# Patient Record
Sex: Female | Born: 1960
Health system: Southern US, Community
[De-identification: ages and names within clinical notes are randomized; demographics above are authoritative.]

## PROBLEM LIST (undated history)

## (undated) DIAGNOSIS — E785 Hyperlipidemia, unspecified: Secondary | ICD-10-CM

## (undated) DIAGNOSIS — I10 Essential (primary) hypertension: Secondary | ICD-10-CM

## (undated) HISTORY — DX: Hyperlipidemia, unspecified: E78.5

## (undated) HISTORY — DX: Essential (primary) hypertension: I10

---

## 2015-02-19 ENCOUNTER — Encounter (HOSPITAL_BASED_OUTPATIENT_CLINIC_OR_DEPARTMENT_OTHER): Payer: Self-pay

## 2015-02-19 ENCOUNTER — Emergency Department (HOSPITAL_BASED_OUTPATIENT_CLINIC_OR_DEPARTMENT_OTHER)
Admission: EM | Admit: 2015-02-19 | Discharge: 2015-02-19 | Disposition: A | Discharge status: Home / Self Care | Payer: Self-pay | Attending: Emergency Medicine | Admitting: Emergency Medicine

## 2015-02-19 DIAGNOSIS — Z720 Tobacco use: Secondary | ICD-10-CM | POA: Insufficient documentation

## 2015-02-19 DIAGNOSIS — L259 Unspecified contact dermatitis, unspecified cause: Secondary | ICD-10-CM | POA: Insufficient documentation

## 2015-02-19 MED ORDER — PREDNISONE 20 MG PO TABS: ORAL_TABLET | ORAL | Status: DC

## 2015-02-19 MED ORDER — DEXAMETHASONE SODIUM PHOSPHATE 10 MG/ML IJ SOLN
10.0000 mg | Freq: Once | INTRAMUSCULAR | Status: AC
  Administered 2015-02-19: 10 mg via INTRAMUSCULAR
  Filled 2015-02-19: qty 1

## 2015-02-19 NOTE — ED Provider Notes (Signed)
CSN: [id number]     Arrival date & time 02/19/15  1525 History   First MD Initiated Contact with Patient 02/19/15 1647     Chief Complaint  Patient presents with  . Poison Oak     (Consider location/radiation/quality/duration/timing/severity/associated sxs/prior Treatment) The history is provided by the patient.   54 year old female complaining of a rash 1 week. 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 The rash is very itchy and painful, and is spreading from her hands, to her arms and now on the left side of her face. She went to [facility] 6 days ago and was given a cortisone injection and a prescription for Vistaril, however the rash continues to worsen. States the last time she had this rash, she was on prednisone which made it go away. She is also been applying hydrocortisone cream with mild relief. Denies difficulty breathing or swallowing.  History reviewed. No pertinent past medical history. History reviewed. No pertinent past surgical history. No family history on file. History  Substance Use Topics  . Smoking status: Current Every Day Smoker -- 0.50 packs/day    Types: Cigarettes  . Smokeless tobacco: Not on file  . Alcohol Use: 1.2 oz/week    2 Cans of beer per week   OB History    No data available     Review of Systems  10 Systems reviewed and are negative for acute change except as noted in the HPI.  Allergies  Review of patient's allergies indicates no known allergies.  Home Medications   Prior to Admission medications   Medication Sig Start Date End Date Taking? Authorizing Provider  predniSONE (DELTASONE) 20 MG tablet 2 tabs po daily x 4 days 02/19/15   Nada Boozerobyn M Analleli Gierke, PA-C   BP 156/84 mmHg  Pulse 63  Temp(Src) 97.6 F (36.4 C) (Oral)  Resp 20  Ht 5\' 2"  (1.575 m)  Wt 137 lb (62.143 kg)  BMI 25.05 kg/m2  SpO2 95% Physical Exam  Constitutional: She is oriented to person,  place, and time. She appears well-developed and well-nourished. No distress.  HENT:  Head: Normocephalic and atraumatic.  Mouth/Throat: Oropharynx is clear and moist.  Eyes: Conjunctivae and EOM are normal.  Neck: Normal range of motion. Neck supple.  Cardiovascular: Normal rate, regular rhythm and normal heart sounds.   Pulmonary/Chest: Effort normal and breath sounds normal. No respiratory distress.  Musculoskeletal: Normal range of motion. She exhibits no edema.  Neurological: She is alert and oriented to person, place, and time. No sensory deficit.  Skin: Skin is warm and dry.  Maculopapular, erythematous, excoriated rash to bilateral arms, thighs, lower legs, and left-sided face. No mucosal lesions. No secondary infection.  Psychiatric: She has a normal mood and affect. Her behavior is normal.  Nursing note and vitals reviewed.   ED Course  Procedures (including critical care time) Labs Review Labs Reviewed - No data to display  Imaging Review No results found.   EKG Interpretation None      MDM   Final diagnoses:  Contact dermatitis   No respiratory or airway compromise. NAD. Afebrile. Rashes appearance of contact dermatitis/poison oak. Will give IM Decadron, discharge home with short prednisone burst for 4 days. Advised her to continue taking Vistaril and applying hydrocortisone cream. Stable for discharge. Return precautions given. Patient states understanding of treatment care plan and is agreeable.  Kathrynn SpeedRobyn M Aeden Matranga, PA-C 02/19/15 1706  Arby BarretteMarcy Pfeiffer,  MD 02/19/15 2200

## 2015-02-19 NOTE — ED Notes (Signed)
Patient stated pain was a 6 not an 8 due to rash from poison ivy

## 2015-02-19 NOTE — Discharge Instructions (Signed)
Take prednisone beginning tomorrow as you were given an injection today in the emergency department. Continue using Vistaril at home and applying hydrocortisone cream.  Contact Dermatitis Contact dermatitis is a reaction to certain substances that touch the skin. Contact dermatitis can be either irritant contact dermatitis or allergic contact dermatitis. Irritant contact dermatitis does not require previous exposure to the substance for a reaction to occur.Allergic contact dermatitis only occurs if you have been exposed to the substance before. Upon a repeat exposure, your body reacts to the substance.  CAUSES  Many substances can cause contact dermatitis. Irritant dermatitis is most commonly caused by repeated exposure to mildly irritating substances, such as:  Makeup.  Soaps.  Detergents.  Bleaches.  Acids.  Metal salts, such as nickel. Allergic contact dermatitis is most commonly caused by exposure to:  Poisonous plants.  Chemicals (deodorants, shampoos).  Jewelry.  Latex.  Neomycin in triple antibiotic cream.  Preservatives in products, including clothing. SYMPTOMS  The area of skin that is exposed may develop:  Dryness or flaking.  Redness.  Cracks.  Itching.  Pain or a burning sensation.  Blisters. With allergic contact dermatitis, there may also be swelling in areas such as the eyelids, mouth, or genitals.  DIAGNOSIS  Your caregiver can usually tell what the problem is by doing a physical exam. In cases where the cause is uncertain and an allergic contact dermatitis is suspected, a patch skin test may be performed to help determine the cause of your dermatitis. TREATMENT Treatment includes protecting the skin from further contact with the irritating substance by avoiding that substance if possible. Barrier creams, powders, and gloves may be helpful. Your caregiver may also recommend:  Steroid creams or ointments applied 2 times daily. For best results, soak  the rash area in cool water for 20 minutes. Then apply the medicine. Cover the area with a plastic wrap. You can store the steroid cream in the refrigerator for a "chilly" effect on your rash. That may decrease itching. Oral steroid medicines may be needed in more severe cases.  Antibiotics or antibacterial ointments if a skin infection is present.  Antihistamine lotion or an antihistamine taken by mouth to ease itching.  Lubricants to keep moisture in your skin.  Burow's solution to reduce redness and soreness or to dry a weeping rash. Mix one packet or tablet of solution in 2 cups cool water. Dip a clean washcloth in the mixture, wring it out a bit, and put it on the affected area. Leave the cloth in place for 30 minutes. Do this as often as possible throughout the day.  Taking several cornstarch or baking soda baths daily if the area is too large to cover with a washcloth. Harsh chemicals, such as alkalis or acids, can cause skin damage that is like a burn. You should flush your skin for 15 to 20 minutes with cold water after such an exposure. You should also seek immediate medical care after exposure. Bandages (dressings), antibiotics, and pain medicine may be needed for severely irritated skin.  HOME CARE INSTRUCTIONS  Avoid the substance that caused your reaction.  Keep the area of skin that is affected away from hot water, soap, sunlight, chemicals, acidic substances, or anything else that would irritate your skin.  Do not scratch the rash. Scratching may cause the rash to become infected.  You may take cool baths to help stop the itching.  Only take over-the-counter or prescription medicines as directed by your caregiver.  See your caregiver for  follow-up care as directed to make sure your skin is healing properly. SEEK MEDICAL CARE IF:   Your condition is not better after 3 days of treatment.  You seem to be getting worse.  You see signs of infection such as swelling,  tenderness, redness, soreness, or warmth in the affected area.  You have any problems related to your medicines. Document Released: 10/26/2000 Document Revised: 01/21/2012 Document Reviewed: 04/03/2011 [facility] Patient Information 2015 [place], [place]. This information is not intended to replace advice given to you by your health care provider. Make sure you discuss any questions you have with your health care provider.

## 2015-02-19 NOTE — ED Notes (Signed)
Pt reports poison oak diffusely on body, pruritic and painful.  Went to [facility], given hydrocortisone but rash still spreading.

## 2016-01-24 ENCOUNTER — Emergency Department (HOSPITAL_BASED_OUTPATIENT_CLINIC_OR_DEPARTMENT_OTHER)
Admission: EM | Admit: 2016-01-24 | Discharge: 2016-01-24 | Disposition: A | Discharge status: Home / Self Care | Payer: Self-pay | Attending: Emergency Medicine | Admitting: Emergency Medicine

## 2016-01-24 ENCOUNTER — Encounter (HOSPITAL_BASED_OUTPATIENT_CLINIC_OR_DEPARTMENT_OTHER): Payer: Self-pay | Admitting: *Deleted

## 2016-01-24 DIAGNOSIS — F17210 Nicotine dependence, cigarettes, uncomplicated: Secondary | ICD-10-CM | POA: Insufficient documentation

## 2016-01-24 DIAGNOSIS — J209 Acute bronchitis, unspecified: Secondary | ICD-10-CM | POA: Insufficient documentation

## 2016-01-24 MED ORDER — AZITHROMYCIN 250 MG PO TABS: ORAL_TABLET | ORAL | Status: DC

## 2016-01-24 MED ORDER — PREDNISONE 10 MG PO TABS: 20.0000 mg | ORAL_TABLET | Freq: Two times a day (BID) | ORAL | Status: DC

## 2016-01-24 MED ORDER — GUAIFENESIN-CODEINE 100-10 MG/5ML PO SOLN: 10.0000 mL | Freq: Four times a day (QID) | ORAL | Status: DC | PRN

## 2016-01-24 MED FILL — AZITHROMYCIN 250 MG TABLET: 250 | 5 days supply | Qty: 6 | Fill #0

## 2016-01-24 MED FILL — predniSONE 10 MG TABS: 10 | 5 days supply | Qty: 20 | Fill #0

## 2016-01-24 MED FILL — CHERATUSSIN AC SYRUP: 100-10 | 3 days supply | Qty: 120 | Fill #0

## 2016-01-24 NOTE — Discharge Instructions (Signed)
Zithromax as prescribed.  Prednisone as prescribed.  Robitussin with codeine as prescribed as needed for cough.  Return to the emergency department if symptoms significantly worsen or change.   Acute Bronchitis Bronchitis is inflammation of the airways that extend from the windpipe into the lungs (bronchi). The inflammation often causes mucus to develop. This leads to a cough, which is the most common symptom of bronchitis.  In acute bronchitis, the condition usually develops suddenly and goes away over time, usually in a couple weeks. Smoking, allergies, and asthma can make bronchitis worse. Repeated episodes of bronchitis may cause further lung problems.  CAUSES Acute bronchitis is most often caused by the same virus that causes a cold. The virus can spread from person to person (contagious) through coughing, sneezing, and touching contaminated objects. SIGNS AND SYMPTOMS   Cough.   Fever.   Coughing up mucus.   Body aches.   Chest congestion.   Chills.   Shortness of breath.   Sore throat.  DIAGNOSIS  Acute bronchitis is usually diagnosed through a physical exam. Your health care provider will also ask you questions about your medical history. Tests, such as chest X-rays, are sometimes done to rule out other conditions.  TREATMENT  Acute bronchitis usually goes away in a couple weeks. Oftentimes, no medical treatment is necessary. Medicines are sometimes given for relief of fever or cough. Antibiotic medicines are usually not needed but may be prescribed in certain situations. In some cases, an inhaler may be recommended to help reduce shortness of breath and control the cough. A cool mist vaporizer may also be used to help thin bronchial secretions and make it easier to clear the chest.  HOME CARE INSTRUCTIONS  Get plenty of rest.   Drink enough fluids to keep your urine clear or pale yellow (unless you have a medical condition that requires fluid restriction).  Increasing fluids may help thin your respiratory secretions (sputum) and reduce chest congestion, and it will prevent dehydration.   Take medicines only as directed by your health care provider.  If you were prescribed an antibiotic medicine, finish it all even if you start to feel better.  Avoid smoking and secondhand smoke. Exposure to cigarette smoke or irritating chemicals will make bronchitis worse. If you are a smoker, consider using nicotine gum or skin patches to help control withdrawal symptoms. Quitting smoking will help your lungs heal faster.   Reduce the chances of another bout of acute bronchitis by washing your hands frequently, avoiding people with cold symptoms, and trying not to touch your hands to your mouth, nose, or eyes.   Keep all follow-up visits as directed by your health care provider.  SEEK MEDICAL CARE IF: Your symptoms do not improve after 1 week of treatment.  SEEK IMMEDIATE MEDICAL CARE IF:  You develop an increased fever or chills.   You have chest pain.   You have severe shortness of breath.  You have bloody sputum.   You develop dehydration.  You faint or repeatedly feel like you are going to pass out.  You develop repeated vomiting.  You develop a severe headache. MAKE SURE YOU:   Understand these instructions.  Will watch your condition.  Will get help right away if you are not doing well or get worse.   This information is not intended to replace advice given to you by your health care provider. Make sure you discuss any questions you have with your health care provider.   Document Released: 12/06/2004 Document  Revised: 11/19/2014 Document Reviewed: 04/21/2013 Elsevier Interactive Patient Education [employer].

## 2016-01-24 NOTE — ED Notes (Signed)
Cough, fever, headache and sore throat x 2 weeks. Aching all over.

## 2016-01-26 NOTE — ED Provider Notes (Signed)
CSN: [id number]     Arrival date & time 01/24/16  1243 History   First MD Initiated Contact with Patient 01/24/16 1308     Chief Complaint  Patient presents with  . Cough     (Consider location/radiation/quality/duration/timing/severity/associated sxs/prior Treatment) HPI Comments: Patient is a 55 year old female who presents with a two-week history of fever, cough, congestion. She has tried over-the-counter medications with no relief. He denies any chest pain or shortness of breath.  Patient is a 55 y.o. female presenting with cough. The history is provided by the patient.  Cough Cough characteristics:  Non-productive and productive Severity:  Moderate Onset quality:  Gradual Duration:  2 weeks Timing:  Constant Progression:  Worsening Chronicity:  New Relieved by:  Nothing Worsened by:  Nothing tried Ineffective treatments:  Cough suppressants Associated symptoms: myalgias   Associated symptoms: no chills and no fever     History reviewed. No pertinent past medical history. History reviewed. No pertinent past surgical history. No family history on file. Social History  Substance Use Topics  . Smoking status: Current Every Day Smoker -- 0.50 packs/day    Types: Cigarettes  . Smokeless tobacco: None  . Alcohol Use: 1.2 oz/week    2 Cans of beer per week   OB History    No data available     Review of Systems  Constitutional: Negative for fever and chills.  Respiratory: Positive for cough.   Musculoskeletal: Positive for myalgias.  All other systems reviewed and are negative.     Allergies  Review of patient's allergies indicates no known allergies.  Home Medications   Prior to Admission medications   Medication Sig Start Date End Date Taking? Authorizing Provider  azithromycin (ZITHROMAX Z-PAK) 250 MG tablet 2 po day one, then 1 daily x 4 days 01/24/16   Geoffery Lyons, MD  guaiFENesin-codeine 100-10 MG/5ML syrup Take 10 mLs by mouth every 6 (six) hours as  needed for cough. 01/24/16   Geoffery Lyons, MD  predniSONE (DELTASONE) 10 MG tablet Take 2 tablets (20 mg total) by mouth 2 (two) times daily. 01/24/16   Geoffery Lyons, MD   BP 160/95 mmHg  Pulse 80  Temp(Src) 98.3 F (36.8 C) (Oral)  Resp 20  Ht  (1.575 m)  Wt 137 lb (62.143 kg)  BMI 25.05 kg/m2  SpO2 100% Physical Exam  Constitutional: She is oriented to person, place, and time. She appears well-developed and well-nourished. No distress.  HENT:  Head: Normocephalic and atraumatic.  Neck: Normal range of motion. Neck supple.  Cardiovascular: Normal rate and regular rhythm.  Exam reveals no gallop and no friction rub.   No murmur heard. Pulmonary/Chest: Effort normal and breath sounds normal. No respiratory distress. She has no wheezes.  Abdominal: Soft. Bowel sounds are normal. She exhibits no distension. There is no tenderness.  Musculoskeletal: Normal range of motion.  Neurological: She is alert and oriented to person, place, and time.  Skin: Skin is warm and dry. She is not diaphoretic.  Nursing note and vitals reviewed.   ED Course  Procedures (including critical care time) Labs Review Labs Reviewed - No data to display  Imaging Review No results found. I have personally reviewed and evaluated these images and lab results as part of my medical decision-making.   EKG Interpretation None      MDM   Final diagnoses:  Acute bronchitis, unspecified organism    Due to the persistent nature of this illness, she will be prescribed antibiotics and when  necessary return.    Geoffery Lyonsouglas Teriana Danker, MD 01/26/16 [phone]

## 2017-03-02 ENCOUNTER — Encounter (HOSPITAL_BASED_OUTPATIENT_CLINIC_OR_DEPARTMENT_OTHER): Payer: Self-pay | Admitting: Emergency Medicine

## 2017-03-02 ENCOUNTER — Emergency Department (HOSPITAL_BASED_OUTPATIENT_CLINIC_OR_DEPARTMENT_OTHER)
Admission: EM | Admit: 2017-03-02 | Discharge: 2017-03-02 | Disposition: A | Discharge status: Home / Self Care | Payer: Self-pay | Attending: Emergency Medicine | Admitting: Emergency Medicine

## 2017-03-02 DIAGNOSIS — L255 Unspecified contact dermatitis due to plants, except food: Secondary | ICD-10-CM | POA: Insufficient documentation

## 2017-03-02 DIAGNOSIS — L237 Allergic contact dermatitis due to plants, except food: Secondary | ICD-10-CM

## 2017-03-02 DIAGNOSIS — F17210 Nicotine dependence, cigarettes, uncomplicated: Secondary | ICD-10-CM | POA: Insufficient documentation

## 2017-03-02 MED ORDER — PREDNISONE 20 MG PO TABS: ORAL_TABLET | ORAL | 0 refills | Status: DC

## 2017-03-02 MED ORDER — DIPHENHYDRAMINE HCL 25 MG PO TABS: 25.0000 mg | ORAL_TABLET | Freq: Four times a day (QID) | ORAL | 0 refills | Status: DC | PRN

## 2017-03-02 NOTE — ED Provider Notes (Signed)
[facility] Provider Note   CSN: [id number] Arrival date & time: 03/02/17  1634  By signing my name below, I, Doreatha Martin and Deland Pretty, attest that this documentation has been prepared under the direction and in the presence of [name], PA-C. Electronically Signed: Doreatha Martin and Deland Pretty, ED Scribe. 03/02/17. 5:14 PM.    History   Chief Complaint Chief Complaint  Patient presents with  . Rash    HPI Sharon Christensen is a 56 y.o. who presents to the Emergency Department complaining of a gradually spreading pruritic rash to the left face and bilateral forearms that began this morning. Pt was chasing a goat last night in the woods prior to the onset of her symptoms. She states she tried applying calamine lotion PTA with no relief. 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 No new soaps, lotions, detergents, pets or medications. No known sick contacts with similar symptoms. She denies trouble breathing, fever, tongue or lip swelling, CP, abdominal pain. No history of diabetes. Has hx of allergy to poison oak/ivy in the past.  The history is provided by the patient. No language interpreter was used.    History reviewed. No pertinent past medical history.  There are no active problems to display for this patient.   History reviewed. No pertinent surgical history.  OB History    No data available       Home Medications    Prior to Admission medications   Not on File    Family History No family history on file.  Social History Social History  Substance Use Topics  . Smoking status: Current Every Day Smoker    Packs/day: 0.50    Types: Cigarettes  . Smokeless tobacco: Not on file  . Alcohol use 1.2 oz/week    2 Cans of beer per week     Allergies   Patient has no known allergies.   Review of Systems Review of Systems  Constitutional: Negative for fever.  HENT: Negative for facial  swelling and trouble swallowing.   Respiratory: Negative for shortness of breath.   Cardiovascular: Negative for chest pain.  Gastrointestinal: Negative for abdominal pain.  Skin: Positive for rash.     Physical Exam Updated Vital Signs BP (!) 181/107 (BP Location: Left Arm)   Pulse 90   Temp 98.4 F (36.9 C) (Oral)   Resp 19   SpO2 98%   Physical Exam  Constitutional: She appears well-developed and well-nourished.  HENT:  Head: Normocephalic.  Normal oral mucosa.  Eyes: Conjunctivae are normal.  Cardiovascular: Normal rate, regular rhythm and normal heart sounds.   Pulmonary/Chest: Effort normal and breath sounds normal. No respiratory distress. She has no wheezes.  Abdominal: She exhibits no distension.  Musculoskeletal: Normal range of motion.  Neurological: She is alert.  Skin: Skin is warm and dry. Rash noted.  Pruritic hives noted to the left side of face and bilateral forearms. No cellulitis or abscess.   Psychiatric: She has a normal mood and affect. Her behavior is normal.  Nursing note and vitals reviewed.    ED Treatments / Results   DIAGNOSTIC STUDIES: Oxygen Saturation is 98% on RA, normal by my interpretation.    COORDINATION OF CARE: 5:10 PM Discussed treatment plan with pt at bedside which includes steroids and antihistamine and pt agreed to plan.   Procedures Procedures (including critical care time)  Medications Ordered in ED Medications - No data to display  Initial Impression / Assessment and Plan / ED Course  I have reviewed the triage vital signs and the nursing notes.     BP (!) 181/107 (BP Location: Left Arm)   Pulse 90   Temp 98.4 F (36.9 C) (Oral)   Resp 19   SpO2 98%  The patient was noted to be hypertensive today in the emergency department. I have spoken with the patient regarding hypertension and the need for improved management. I instructed the patient to followup with the Primary care doctor within 4 days to improve the  management of the patient's hypertension. I also counseled the patient regarding the signs and symptoms which would require an emergent visit to an emergency department for hypertensive urgency and/or hypertensive emergency. The patient understood the need for improved hypertensive management.   [name] presents to the ED for evaluation of possible poison ivy/oak exposure. Patient will be sent home with steroid course. Conservative therapies discussed and recommended, including Benadryl, oatmeal baths and calamine lotions. Patient advised to follow up with PCP as needed, or with worsening symptoms. Patient appears stable for discharge at this time. Return precautions discussed and outlined in discharge paperwork. Patient is agreeable to plan.     Final Clinical Impressions(s) / ED Diagnoses   Final diagnoses:  Contact dermatitis due to poison ivy    New Prescriptions New Prescriptions   DIPHENHYDRAMINE (BENADRYL) 25 MG TABLET    Take 1 tablet (25 mg total) by mouth every 6 (six) hours as needed for itching or allergies.   PREDNISONE (DELTASONE) 20 MG TABLET    3 tabs po daily x 3 days, then 2 tabs x 3 days, then 1.5 tabs x 3 days, then 1 tab x 3 days, then 0.5 tabs x 3 days    I personally performed the services described in this documentation, which was scribed in my presence. The recorded information has been reviewed and is accurate.       [name], PA-C 03/02/17 1722    Laurence Spates, MD 03/05/17 [phone]

## 2017-03-19 ENCOUNTER — Emergency Department (HOSPITAL_BASED_OUTPATIENT_CLINIC_OR_DEPARTMENT_OTHER): Payer: Self-pay

## 2017-03-19 ENCOUNTER — Encounter (HOSPITAL_BASED_OUTPATIENT_CLINIC_OR_DEPARTMENT_OTHER): Payer: Self-pay | Admitting: Emergency Medicine

## 2017-03-19 ENCOUNTER — Emergency Department (HOSPITAL_BASED_OUTPATIENT_CLINIC_OR_DEPARTMENT_OTHER)
Admission: EM | Admit: 2017-03-19 | Discharge: 2017-03-19 | Disposition: A | Discharge status: Home / Self Care | Payer: Self-pay | Attending: Emergency Medicine | Admitting: Emergency Medicine

## 2017-03-19 DIAGNOSIS — W19XXXA Unspecified fall, initial encounter: Secondary | ICD-10-CM

## 2017-03-19 DIAGNOSIS — S93402A Sprain of unspecified ligament of left ankle, initial encounter: Secondary | ICD-10-CM | POA: Insufficient documentation

## 2017-03-19 DIAGNOSIS — Y999 Unspecified external cause status: Secondary | ICD-10-CM | POA: Insufficient documentation

## 2017-03-19 DIAGNOSIS — W108XXA Fall (on) (from) other stairs and steps, initial encounter: Secondary | ICD-10-CM | POA: Insufficient documentation

## 2017-03-19 DIAGNOSIS — Y9301 Activity, walking, marching and hiking: Secondary | ICD-10-CM | POA: Insufficient documentation

## 2017-03-19 DIAGNOSIS — Z79899 Other long term (current) drug therapy: Secondary | ICD-10-CM | POA: Insufficient documentation

## 2017-03-19 DIAGNOSIS — F17210 Nicotine dependence, cigarettes, uncomplicated: Secondary | ICD-10-CM | POA: Insufficient documentation

## 2017-03-19 DIAGNOSIS — Y929 Unspecified place or not applicable: Secondary | ICD-10-CM | POA: Insufficient documentation

## 2017-03-19 NOTE — Discharge Instructions (Signed)
Please call [facility] to follow up with your blood pressure. You can also mention the fatigue. Please keep your ankle elevated above the level of your heart when sitting, apply ice for 15-20 minutes 3-4 times per day, and rest your foot. You can also take 800 mg of ibuprofen/motrin/Advil every 8 hours. Try to slowly start moving the foot to keep it from getting overly stiff. It is normal for the bruising to change colors and may look worse before it looks better. If you develop a fever, chills, the foot becomes icy cold, numbness or tingling, or worsening pain, please return to the Emergency Department for re-evaluation.

## 2017-03-19 NOTE — ED Triage Notes (Signed)
Pt fell on porch last night.  Pt landed on left foot.  Noted swelling and bruising to left ankle.  Pt limping.  Good cms.

## 2017-03-22 NOTE — ED Provider Notes (Signed)
[facility] Provider Note   CSN: [id number] Arrival date & time: 03/19/17  0957     History   Chief Complaint Chief Complaint  Patient presents with  . Ankle Pain    HPI Sharon Christensen is a 56 y.o. female who presents to the Emergency Department with acute left ankle pain. She report that she was walking down stairs last night, when she fell forward and landed on her left ankle. She reports she did not hit her head, lose conscious, N/V/D. She has been able to ambulate wimp. Tetanus is UTD.   The history is provided by the patient. No language interpreter was used.    No past medical history on file.  There are no active problems to display for this patient.   No past surgical history on file.  OB History    No data available       Home Medications    Prior to Admission medications   Medication Sig Start Date End Date Taking? Authorizing Provider  pseudoephedrine (SUDAFED) 30 MG tablet Take 30 mg by mouth every 4 (four) hours as needed for congestion.   Yes [provider]  diphenhydrAMINE (BENADRYL) 25 MG tablet Take 1 tablet (25 mg total) by mouth every 6 (six) hours as needed for itching or allergies. 03/02/17   [name], PA-C    Family History No family history on file.  Social History Social History  Substance Use Topics  . Smoking status: Current Every Day Smoker    Packs/day: 0.50    Types: Cigarettes  . Smokeless tobacco: Never Used  . Alcohol use 1.2 oz/week    2 Cans of beer per week     Allergies   Patient has no known allergies.   Review of Systems Review of Systems  Constitutional: Negative for activity change, chills and fever.  HENT: Negative for congestion.   Eyes: Negative for visual disturbance.  Respiratory: Negative for shortness of breath.   Cardiovascular: Negative for chest pain.  Gastrointestinal: Negative for abdominal pain, diarrhea, nausea and vomiting.  Genitourinary: Negative for flank pain.    Musculoskeletal: Positive for arthralgias, gait problem, joint swelling and myalgias. Negative for back pain.  Skin: Positive for wound. Negative for rash.  Allergic/Immunologic: Negative for immunocompromised state.  Neurological: Negative for dizziness, light-headedness and headaches.  Psychiatric/Behavioral: Negative for confusion.   Physical Exam Updated Vital Signs BP (!) 171/109   Pulse 84   Temp 97.7 F (36.5 C) (Oral)   Resp 16   Ht 5\' 2"  (1.575 m)   Wt 65.8 kg   SpO2 98%   BMI 26.52 kg/m   Physical Exam  Constitutional: She appears well-developed and well-nourished. No distress.  HENT:  Head: Normocephalic and atraumatic.  Eyes: Conjunctivae are normal.  Neck: Neck supple.  Cardiovascular: Normal rate and regular rhythm.  Exam reveals no gallop and no friction rub.   No murmur heard. Pulmonary/Chest: Effort normal and breath sounds normal. No respiratory distress. She has no wheezes. She has no rales.  Abdominal: Soft. She exhibits no distension. There is no tenderness. There is no guarding.  Musculoskeletal: Normal range of motion. She exhibits edema and tenderness.  Full ROM of the left ankle. TTP over the anterolateral aspect with extensive swelling and induration. NVI.   Neurological: She is alert.  Skin: Skin is warm and dry. No rash noted. She is not diaphoretic.  Psychiatric: Her behavior is normal.  Nursing note and vitals reviewed.    ED Treatments /  Results  Labs (all labs ordered are listed, but only abnormal results are displayed) Labs Reviewed - No data to display  EKG  EKG Interpretation None       Radiology No results found.  Procedures Procedures (including critical care time)  Medications Ordered in ED Medications - No data to display   Initial Impression / Assessment and Plan / ED Course  I have reviewed the triage vital signs and the nursing notes.  Pertinent labs & imaging results that were available during my care of the  patient were reviewed by me and considered in my medical decision making (see chart for details).     Patient X-Ray negative for obvious fracture or dislocation. Pt advised to follow up with orthopedics if symptoms persist for possibility of missed fracture diagnosis. Patient given brace and crutches while in ED, conservative therapy recommended and discussed. BP initially 181/107 to 171/109. No end organ damage noted in history or physical [facility] with her blood pressure.. Patient will be dc home & is agreeable with above plan.  Final Clinical Impressions(s) / ED Diagnoses   Final diagnoses:  Sprain of left ankle, initial encounter    New Prescriptions Discharge Medication List as of 03/19/2017 10:56 AM       Barkley BoardsMcDonald, Khandi Kernes A, PA-C 03/22/17 0116    Charlynne PanderYao, David Hsienta, MD 03/23/17 [phone]

## 2017-06-28 ENCOUNTER — Ambulatory Visit (INDEPENDENT_AMBULATORY_CARE_PROVIDER_SITE_OTHER): Payer: Self-pay | Admitting: Family Medicine

## 2017-06-28 ENCOUNTER — Encounter: Payer: Self-pay | Admitting: Family Medicine

## 2017-06-28 VITALS — BP 140/77 | HR 77 | Temp 97.7°F | Resp 14 | Ht 62.0 in | Wt 144.0 lb

## 2017-06-28 DIAGNOSIS — Z1239 Encounter for other screening for malignant neoplasm of breast: Secondary | ICD-10-CM

## 2017-06-28 DIAGNOSIS — R5383 Other fatigue: Secondary | ICD-10-CM

## 2017-06-28 DIAGNOSIS — Z1159 Encounter for screening for other viral diseases: Secondary | ICD-10-CM

## 2017-06-28 DIAGNOSIS — Z1231 Encounter for screening mammogram for malignant neoplasm of breast: Secondary | ICD-10-CM

## 2017-06-28 DIAGNOSIS — I10 Essential (primary) hypertension: Secondary | ICD-10-CM

## 2017-06-28 LAB — POCT URINALYSIS DIP (DEVICE)
Bilirubin Urine: NEGATIVE
Glucose, UA: NEGATIVE mg/dL
HGB URINE DIPSTICK: NEGATIVE
Ketones, ur: NEGATIVE mg/dL
Leukocytes, UA: NEGATIVE
NITRITE: NEGATIVE
PH: 5 (ref 5.0–8.0)
PROTEIN: NEGATIVE mg/dL
Specific Gravity, Urine: 1.025 (ref 1.005–1.030)
UROBILINOGEN UA: 0.2 mg/dL (ref 0.0–1.0)

## 2017-06-28 LAB — CBC WITH DIFFERENTIAL/PLATELET
BASOS ABS: 55 {cells}/uL (ref 0–200)
BASOS PCT: 1 %
EOS ABS: 55 {cells}/uL (ref 15–500)
Eosinophils Relative: 1 %
HEMATOCRIT: 41 % (ref 35.0–45.0)
HEMOGLOBIN: 13.7 g/dL (ref 11.7–15.5)
Lymphocytes Relative: 32 %
Lymphs Abs: 1760 cells/uL (ref 850–3900)
MCH: 32.2 pg (ref 27.0–33.0)
MCHC: 33.4 g/dL (ref 32.0–36.0)
MCV: 96.2 fL (ref 80.0–100.0)
MONOS PCT: 9 %
MPV: 9.1 fL (ref 7.5–12.5)
Monocytes Absolute: 495 cells/uL (ref 200–950)
Neutro Abs: 3135 cells/uL (ref 1500–7800)
Neutrophils Relative %: 57 %
Platelets: 352 10*3/uL (ref 140–400)
RBC: 4.26 MIL/uL (ref 3.80–5.10)
RDW: 13 % (ref 11.0–15.0)
WBC: 5.5 10*3/uL (ref 3.8–10.8)

## 2017-06-28 LAB — POCT GLYCOSYLATED HEMOGLOBIN (HGB A1C): HEMOGLOBIN A1C: 5.5

## 2017-06-28 MED ORDER — LISINOPRIL 20 MG PO TABS: 20.0000 mg | ORAL_TABLET | Freq: Every day | ORAL | 3 refills | Status: DC

## 2017-06-28 MED FILL — LISINOPRIL 20 MG TAB: 20 | 30 days supply | Qty: 30 | Fill #0

## 2017-06-28 NOTE — Patient Instructions (Addendum)
Start daily aspirin 81 mg for optimal cardiovascular health. I am starting you on lisinopril 20 mg once daily for hypertension management. For nasal congestion, I recommend starting a daily antihistamine regiment with either Cetirizine or  Fexofenadine.  To schedule your breast exam, please call [facility] at   Phone: [phone]   Dizziness Dizziness is a common problem. It makes you feel unsteady or light-headed. You may feel like you are about to pass out (faint). Dizziness can lead to getting hurt if you stumble or fall. Dizziness can be caused by many things, including:  Medicines.  Not having enough water in your body (dehydration).  Illness.  Follow these instructions at home: Eating and drinking  Drink enough fluid to keep your pee (urine) clear or pale yellow. This helps to keep you from getting dehydrated. Try to drink more clear fluids, such as water.  Do not drink alcohol.  Limit how much caffeine you drink or eat, if your doctor tells you to do that.  Limit how much salt (sodium) you drink or eat, if your doctor tells you to do that. Activity  Avoid making quick movements. ? When you stand up from sitting in a chair, steady yourself until you feel okay. ? In the morning, first sit up on the side of the bed. When you feel okay, stand slowly while you hold onto something. Do this until you know that your balance is fine.  If you need to stand in one place for a long time, move your legs often. Tighten and relax the muscles in your legs while you are standing.  Do not drive or use heavy machinery if you feel dizzy.  Avoid bending down if you feel dizzy. Place items in your home so you can reach them easily without leaning over. Lifestyle  Do not use any products that contain nicotine or tobacco, such as cigarettes and e-cigarettes. If you need help quitting, ask your doctor.  Try to lower your stress level. You can do this by using methods such as  yoga or meditation. Talk with your doctor if you need help. General instructions  Watch your dizziness for any changes.  Take over-the-counter and prescription medicines only as told by your doctor. Talk with your doctor if you think that you are dizzy because of a medicine that you are taking.  Tell a friend or a family member that you are feeling dizzy. If he or she notices any changes in your behavior, have this person call your doctor.  Keep all follow-up visits as told by your doctor. This is important. Contact a doctor if:  Your dizziness does not go away.  Your dizziness or light-headedness gets worse.  You feel sick to your stomach (nauseous).  You have trouble hearing.  You have new symptoms.  You are unsteady on your feet.  You feel like the room is spinning. Get help right away if:  You throw up (vomit) or have watery poop (diarrhea), and you cannot eat or drink anything.  You have trouble: ? Talking. ? Walking. ? Swallowing. ? Using your arms, hands, or legs.  You feel generally weak.  You are not thinking clearly, or you have trouble forming sentences. A friend or family member may notice this.  You have: ? Chest pain. ? Pain in your belly (abdomen). ? Shortness of breath. ? Sweating.  Your vision changes.  You are bleeding.  You have a very bad headache.  You have neck pain  or a stiff neck.  You have a fever. These symptoms may be an emergency. Do not wait to see if the symptoms will go away. Get medical help right away. Call your local emergency services (911 in the [place] Do not drive yourself to the hospital. Summary  Dizziness makes you feel unsteady or light-headed. You may feel like you are about to pass out (faint).  Drink enough fluid to keep your pee (urine) clear or pale yellow. Do not drink alcohol.  Avoid making quick movements if you feel dizzy.  Watch your dizziness for any changes. This information is not intended to replace  advice given to you by your health care provider. Make sure you discuss any questions you have with your health care provider. Document Released: 10/18/2011 Document Revised: 11/15/2016 Document Reviewed: 11/15/2016 Elsevier Interactive Patient Education  2017 [employer]  Fatigue Fatigue is feeling tired all of the time, a lack of energy, or a lack of motivation. Occasional or mild fatigue is often a normal response to activity or life in general. However, long-lasting (chronic) or extreme fatigue may indicate an underlying medical condition. Follow these instructions at home: Watch your fatigue for any changes. The following actions may help to lessen any discomfort you are feeling:  Talk to your health care provider about how much sleep you need each night. Try to get the required amount every night.  Take medicines only as directed by your health care provider.  Eat a healthy and nutritious diet. Ask your health care provider if you need help changing your diet.  Drink enough fluid to keep your urine clear or pale yellow.  Practice ways of relaxing, such as yoga, meditation, massage therapy, or acupuncture.  Exercise regularly.  Change situations that cause you stress. Try to keep your work and personal routine reasonable.  Do not abuse illegal drugs.  Limit alcohol intake to no more than 1 drink per day for nonpregnant women and 2 drinks per day for men. One drink equals 12 ounces of beer, 5 ounces of wine, or 1 ounces of hard liquor.  Take a multivitamin, if directed by your health care provider.  Contact a health care provider if:  Your fatigue does not get better.  You have a fever.  You have unintentional weight loss or gain.  You have headaches.  You have difficulty: ? Falling asleep. ? Sleeping throughout the night.  You feel angry, guilty, anxious, or sad.  You are unable to have a bowel movement (constipation).  You skin is dry.  Your legs or another  part of your body is swollen. Get help right away if:  You feel confused.  Your vision is blurry.  You feel faint or pass out.  You have a severe headache.  You have severe abdominal, pelvic, or back pain.  You have chest pain, shortness of breath, or an irregular or fast heartbeat.  You are unable to urinate or you urinate less than normal.  You develop abnormal bleeding, such as bleeding from the rectum, vagina, nose, lungs, or nipples.  You vomit blood.  You have thoughts about harming yourself or committing suicide.  You are worried that you might harm someone else. This information is not intended to replace advice given to you by your health care provider. Make sure you discuss any questions you have with your health care provider. Document Released: 08/26/2007 Document Revised: 04/05/2016 Document Reviewed: 03/02/2014 Elsevier Interactive Patient Education  2018 [employer] Inc[employer]  DASH Eating Plan DASH  stands for "Dietary Approaches to Stop Hypertension." The DASH eating plan is a healthy eating plan that has been shown to reduce high blood pressure (hypertension). It may also reduce your risk for type 2 diabetes, heart disease, and stroke. The DASH eating plan may also help with weight loss. What are tips for following this plan? General guidelines  Avoid eating more than 2,300 mg (milligrams) of salt (sodium) a day. If you have hypertension, you may need to reduce your sodium intake to 1,500 mg a day.  Limit alcohol intake to no more than 1 drink a day for nonpregnant women and 2 drinks a day for men. One drink equals 12 oz of beer, 5 oz of wine, or 1 oz of hard liquor.  Work with your health care provider to maintain a healthy body weight or to lose weight. Ask what an ideal weight is for you.  Get at least 30 minutes of exercise that causes your heart to beat faster (aerobic exercise) most days of the week. Activities may include walking, swimming, or biking.  Work  with your health care provider or diet and nutrition specialist (dietitian) to adjust your eating plan to your individual calorie needs. Reading food labels  Check food labels for the amount of sodium per serving. Choose foods with less than 5 percent of the Daily Value of sodium. Generally, foods with less than 300 mg of sodium per serving fit into this eating plan.  To find whole grains, look for the word "whole" as the first word in the ingredient list. Shopping  Buy products labeled as "low-sodium" or "no salt added."  Buy fresh foods. Avoid canned foods and premade or frozen meals. Cooking  Avoid adding salt when cooking. Use salt-free seasonings or herbs instead of table salt or sea salt. Check with your health care provider or pharmacist before using salt substitutes.  Do not fry foods. Cook foods using healthy methods such as baking, boiling, grilling, and broiling instead.  Cook with heart-healthy oils, such as olive, canola, soybean, or sunflower oil. Meal planning   Eat a balanced diet that includes: ? 5 or more servings of fruits and vegetables each day. At each meal, try to fill half of your plate with fruits and vegetables. ? Up to 6-8 servings of whole grains each day. ? Less than 6 oz of lean meat, poultry, or fish each day. A 3-oz serving of meat is about the same size as a deck of cards. One egg equals 1 oz. ? 2 servings of low-fat dairy each day. ? A serving of nuts, seeds, or beans 5 times each week. ? Heart-healthy fats. Healthy fats called Omega-3 fatty acids are found in foods such as flaxseeds and coldwater fish, like sardines, salmon, and mackerel.  Limit how much you eat of the following: ? Canned or prepackaged foods. ? Food that is high in trans fat, such as fried foods. ? Food that is high in saturated fat, such as fatty meat. ? Sweets, desserts, sugary drinks, and other foods with added sugar. ? Full-fat dairy products.  Do not salt foods before  eating.  Try to eat at least 2 vegetarian meals each week.  Eat more home-cooked food and less restaurant, buffet, and fast food.  When eating at a restaurant, ask that your food be prepared with less salt or no salt, if possible. What foods are recommended? The items listed may not be a complete list. Talk with your dietitian about what dietary choices are  best for you. Grains Whole-grain or whole-wheat bread. Whole-grain or whole-wheat pasta. Brown rice. Orpah Cobb. Bulgur. Whole-grain and low-sodium cereals. Pita bread. Low-fat, low-sodium crackers. Whole-wheat flour tortillas. Vegetables Fresh or frozen vegetables (raw, steamed, roasted, or grilled). Low-sodium or reduced-sodium tomato and vegetable juice. Low-sodium or reduced-sodium tomato sauce and tomato paste. Low-sodium or reduced-sodium canned vegetables. Fruits All fresh, dried, or frozen fruit. Canned fruit in natural juice (without added sugar). Meat and other protein foods Skinless chicken or [place]. Ground chicken or [place]. Pork with fat trimmed off. Fish and seafood. Egg whites. Dried beans, peas, or lentils. Unsalted nuts, nut butters, and seeds. Unsalted canned beans. Lean cuts of beef with fat trimmed off. Low-sodium, lean deli meat. Dairy Low-fat (1%) or fat-free (skim) milk. Fat-free, low-fat, or reduced-fat cheeses. Nonfat, low-sodium ricotta or cottage cheese. Low-fat or nonfat yogurt. Low-fat, low-sodium cheese. Fats and oils Soft margarine without trans fats. Vegetable oil. Low-fat, reduced-fat, or light mayonnaise and salad dressings (reduced-sodium). Canola, safflower, olive, soybean, and sunflower oils. Avocado. Seasoning and other foods Herbs. Spices. Seasoning mixes without salt. Unsalted popcorn and pretzels. Fat-free sweets. What foods are not recommended? The items listed may not be a complete list. Talk with your dietitian about what dietary choices are best for you. Grains Baked goods made with fat,  such as croissants, muffins, or some breads. Dry pasta or rice meal packs. Vegetables Creamed or fried vegetables. Vegetables in a cheese sauce. Regular canned vegetables (not low-sodium or reduced-sodium). Regular canned tomato sauce and paste (not low-sodium or reduced-sodium). Regular tomato and vegetable juice (not low-sodium or reduced-sodium). Rosita Fire. Olives. Fruits Canned fruit in a light or heavy syrup. Fried fruit. Fruit in cream or butter sauce. Meat and other protein foods Fatty cuts of meat. Ribs. Fried meat. Tomasa Blase. Sausage. Bologna and other processed lunch meats. Salami. Fatback. Hotdogs. Bratwurst. Salted nuts and seeds. Canned beans with added salt. Canned or smoked fish. Whole eggs or egg yolks. Chicken or [place] with skin. Dairy Whole or 2% milk, cream, and half-and-half. Whole or full-fat cream cheese. Whole-fat or sweetened yogurt. Full-fat cheese. Nondairy creamers. Whipped toppings. Processed cheese and cheese spreads. Fats and oils Butter. Stick margarine. Lard. Shortening. Ghee. Bacon fat. Tropical oils, such as coconut, palm kernel, or palm oil. Seasoning and other foods Salted popcorn and pretzels. Onion salt, garlic salt, seasoned salt, table salt, and sea salt. Worcestershire sauce. Tartar sauce. Barbecue sauce. Teriyaki sauce. Soy sauce, including reduced-sodium. Steak sauce. Canned and packaged gravies. Fish sauce. Oyster sauce. Cocktail sauce. Horseradish that you find on the shelf. Ketchup. Mustard. Meat flavorings and tenderizers. Bouillon cubes. Hot sauce and Tabasco sauce. Premade or packaged marinades. Premade or packaged taco seasonings. Relishes. Regular salad dressings. Where to find more information:  National Heart, Lung, and Blood Institute: PopSteam.is  American Heart Association: www.heart.org Summary  The DASH eating plan is a healthy eating plan that has been shown to reduce high blood pressure (hypertension). It may also reduce your risk for  type 2 diabetes, heart disease, and stroke.  With the DASH eating plan, you should limit salt (sodium) intake to 2,300 mg a day. If you have hypertension, you may need to reduce your sodium intake to 1,500 mg a day.  When on the DASH eating plan, aim to eat more fresh fruits and vegetables, whole grains, lean proteins, low-fat dairy, and heart-healthy fats.  Work with your health care provider or diet and nutrition specialist (dietitian) to adjust your eating plan to your individual calorie needs. This  information is not intended to replace advice given to you by your health care provider. Make sure you discuss any questions you have with your health care provider. Document Released: 10/18/2011 Document Revised: 10/22/2016 Document Reviewed: 10/22/2016 Elsevier Interactive Patient Education  2017 [employer].

## 2017-06-28 NOTE — Progress Notes (Signed)
Patient ID: Sharon Christensen, female    DOB: [birth year], 56 y.o.   MRN: [id number]  PCP: Bing Neighbors, FNP  Chief Complaint  Patient presents with  . Establish Care  . Hypertension  . Fatigue    Subjective:  HPI Sharon Christensen is a 56 y.o. female presents to establish care, hypertension, and fatigue. Medical history significant for hypertension and hx head injury (3 years prior).  Sharon Christensen reports a history of a head injury which occurred almost 3 years ago. Since that time she has experienced dizziness with positional movements such as bending over. She denies any associated chronic headaches, nausea or vomiting. She was diagnosed with hypertension less than 1 year ago and doesn't routinely monitor her blood pressure at home, although reports adherence to current medication regimen. She is a current smoker who originally quit 20 years ago and resumed smoking 5 years prior. Smokes an average of 1 pack per day. Her mother died of a heart attack at age 77 and father died at 20 of complications of lung cancer. Sharon Christensen's only complaints today include chronic fatigue and 3 month history of intermittent nasal and ear congestion. She experiences fatigue in spite of obtaining 6-7 hours of restful sleep. She works in a [employer] which is labor intensive and on average can work up to 60 hours weekly, in 10 hour shift increments. Sharon Christensen also complains of intermittent nasal and ear congestion persisting over 3 months. Reports no prior history of allergies. Denies associate throat pain or cough. Has an occasional headache coincide with congestion. She takes sudafed which alleviates symptoms. She has no other complaints today. Social History   Social History  . Marital status: Single    Spouse name: N/A  . Number of children: N/A  . Years of education: N/A   Occupational History  . Not on file.   Social History Main Topics  . Smoking status: Current Every Day Smoker    Packs/day: 0.50     Types: Cigarettes  . Smokeless tobacco: Never Used  . Alcohol use 1.2 oz/week    2 Cans of beer per week  . Drug use: No  . Sexual activity: Not on file   Other Topics Concern  . Not on file   Social History Narrative  . No narrative on file    Family History  Problem Relation Age of Onset  . Diabetes Mother   . Cancer Father   . Diabetes Maternal Aunt    Review of Systems  Constitutional: Positive for fatigue.  HENT: Positive for congestion.   Respiratory: Negative.  Negative for cough and shortness of breath.   Cardiovascular: Negative.   Gastrointestinal: Negative.   Endocrine: Negative.   Musculoskeletal: Negative.   Neurological: Positive for dizziness. Negative for speech difficulty, weakness, light-headedness, numbness and headaches.  Psychiatric/Behavioral: Negative.    No Known Allergies  Prior to Admission medications   Medication Sig Start Date End Date Taking? Authorizing Provider  diphenhydrAMINE (BENADRYL) 25 MG tablet Take 1 tablet (25 mg total) by mouth every 6 (six) hours as needed for itching or allergies. Patient not taking: Reported on 06/28/2017 03/02/17   [name], PA-C  pseudoephedrine (SUDAFED) 30 MG tablet Take 30 mg by mouth every 4 (four) hours as needed for congestion.    [provider]    Past Medical, Surgical Family and Social History reviewed and updated.    Objective:   Today's Vitals   06/28/17 0939  BP: 140/77  Pulse: 77  Resp:  14  Temp: 97.7 F (36.5 C)  TempSrc: Oral  SpO2: 96%  Weight: 144 lb (65.3 kg)  Height: 5\' 2"  (1.575 m)    Wt Readings from Last 3 Encounters:  06/28/17 144 lb (65.3 kg)  03/19/17 145 lb (65.8 kg)  01/24/16 137 lb (62.1 kg)    Physical Exam  Constitutional: She is oriented to person, place, and time. She appears well-developed and well-nourished.  HENT:  Head: Normocephalic and atraumatic.  Right Ear: External ear normal.  Left Ear: External ear normal.  Nose: Nose normal.   Mouth/Throat: Oropharynx is clear and moist.  Eyes: Pupils are equal, round, and reactive to light. Conjunctivae and EOM are normal.  Neck: Normal range of motion. Neck supple.  Cardiovascular: Normal rate, regular rhythm, normal heart sounds and intact distal pulses.   Pulmonary/Chest: Effort normal and breath sounds normal.  Abdominal: Soft. Bowel sounds are normal.  Musculoskeletal: Normal range of motion.  Neurological: She is alert and oriented to person, place, and time.  Skin: Skin is warm and dry.  Psychiatric: She has a normal mood and affect. Her behavior is normal. Judgment and thought content normal.   Assessment & Plan:  1. Essential hypertension, Stable. Checking fasting lipid panel today.  -Continue lisinopril 20 mg once daily. -Increase physical activity as tolerated -Smoking cessation is necessary for optimal BP control   2. Fatigue, unspecified type, checking for underlying metabolic causes of fatigue. If negative, fatigue is likely related to physical deconditioning. -Recommend smoking cessation  3. Encounter for hepatitis C screening test for low risk patient - Hepatitis C antibody  4. Screening for breast cancer - MM Digital Screening; Future   Orders Placed This Encounter  Procedures  . MM Digital Screening  . COMPLETE METABOLIC PANEL WITH GFR  . Thyroid Panel With TSH  . CBC with Differential/Platelet  . Lipid panel  . Hepatitis C antibody  . POCT glycosylated hemoglobin (Hb A1C)  . POCT urinalysis dip (device)  . EKG 12-Lead    RTC: 6 months for routine wellness (address overdue health maintenance) and hypertension follow-up.  [name]. Tiburcio Pea, MSN, FNP-C [facility]  [name] [place], [place] [address] [phone]

## 2017-06-29 LAB — LIPID PANEL
Cholesterol: 222 mg/dL — ABNORMAL HIGH (ref <200)
HDL: 75 mg/dL (ref >50)
LDL CALC: 133 mg/dL — AB (ref <100)
TRIGLYCERIDES: 71 mg/dL (ref <150)
Total CHOL/HDL Ratio: 3 Ratio (ref <5.0)
VLDL: 14 mg/dL (ref <30)

## 2017-06-29 LAB — COMPLETE METABOLIC PANEL WITH GFR
ALBUMIN: 4.5 g/dL (ref 3.6–5.1)
ALT: 19 U/L (ref 6–29)
AST: 20 U/L (ref 10–35)
Alkaline Phosphatase: 82 U/L (ref 33–130)
BILIRUBIN TOTAL: 0.5 mg/dL (ref 0.2–1.2)
BUN: 15 mg/dL (ref 7–25)
CO2: 23 mmol/L (ref 20–32)
Calcium: 9.7 mg/dL (ref 8.6–10.4)
Chloride: 104 mmol/L (ref 98–110)
Creat: 0.69 mg/dL (ref 0.50–1.05)
Glucose, Bld: 102 mg/dL — ABNORMAL HIGH (ref 65–99)
Potassium: 4.5 mmol/L (ref 3.5–5.3)
Sodium: 138 mmol/L (ref 135–146)
Total Protein: 6.8 g/dL (ref 6.1–8.1)

## 2017-06-29 LAB — HEPATITIS C ANTIBODY: HCV Ab: NONREACTIVE

## 2017-06-29 LAB — THYROID PANEL WITH TSH
FREE THYROXINE INDEX: 2 (ref 1.4–3.8)
T3 UPTAKE: 33 % (ref 22–35)
T4, Total: 6 ug/dL (ref 5.1–11.9)
TSH: 0.93 mIU/L

## 2017-07-01 MED ORDER — FISH OIL 1000 MG PO CAPS: 3000.0000 mg | ORAL_CAPSULE | Freq: Every day | ORAL | 2 refills | Status: DC

## 2017-07-11 ENCOUNTER — Other Ambulatory Visit (HOSPITAL_COMMUNITY)
Admission: RE | Admit: 2017-07-11 | Discharge: 2017-07-11 | Disposition: A | Discharge status: Home / Self Care | Payer: Self-pay | Source: Ambulatory Visit | Attending: Family Medicine | Admitting: Family Medicine

## 2017-07-11 ENCOUNTER — Encounter: Payer: Self-pay | Admitting: Family Medicine

## 2017-07-11 ENCOUNTER — Ambulatory Visit (INDEPENDENT_AMBULATORY_CARE_PROVIDER_SITE_OTHER): Payer: Self-pay | Admitting: Family Medicine

## 2017-07-11 VITALS — BP 142/80 | HR 85 | Temp 98.1°F | Resp 14 | Ht 62.0 in | Wt 144.0 lb

## 2017-07-11 DIAGNOSIS — Z01419 Encounter for gynecological examination (general) (routine) without abnormal findings: Secondary | ICD-10-CM

## 2017-07-11 NOTE — Progress Notes (Signed)
Patient ID: Sharon Christensen, female    DOB: 22-Dec-1960, 56 y.o.   MRN: [id number]  PCP: Bing Neighbors, FNP  Chief Complaint  Patient presents with  . Gynecologic Exam    Subjective:  HPI Sharon Christensen is a 56 y.o. female presents for evaluation of gynecological exam.  She has no prior history of an abnormal pap smear. [name] is scheduled to obtain a mammogram.  Denies dysuria, vaginal dryness, vaginal pain, or postmenopausal bleeding.  She denies a family history of breast cancer, ovarian, or cervical cancers. Social History   Social History  . Marital status: Single    Spouse name: N/A  . Number of children: N/A  . Years of education: N/A   Occupational History  . Not on file.   Social History Main Topics  . Smoking status: Current Every Day Smoker    Packs/day: 0.50    Types: Cigarettes  . Smokeless tobacco: Never Used  . Alcohol use 1.2 oz/week    2 Cans of beer per week  . Drug use: No  . Sexual activity: Not on file   Other Topics Concern  . Not on file   Social History Narrative  . No narrative on file    Family History  Problem Relation Age of Onset  . Diabetes Mother   . Cancer Father   . Diabetes Maternal Aunt     Review of Systems  See HPI  There are no active problems to display for this patient.   No Known Allergies  Prior to Admission medications   Medication Sig Start Date End Date Taking? Authorizing Provider  lisinopril (PRINIVIL,ZESTRIL) 20 MG tablet Take 1 tablet (20 mg total) by mouth daily. 06/28/17  Yes Bing Neighbors, FNP  Omega-3 Fatty Acids (FISH OIL) 1000 MG CAPS Take 3 capsules (3,000 mg total) by mouth daily. 07/01/17  Yes Bing Neighbors, FNP  diphenhydrAMINE (BENADRYL) 25 MG tablet Take 1 tablet (25 mg total) by mouth every 6 (six) hours as needed for itching or allergies. Patient not taking: Reported on 07/11/2017 03/02/17   [name], PA-C  pseudoephedrine (SUDAFED) 30 MG tablet Take 30 mg by mouth every 4 (four)  hours as needed for congestion.    [provider]    Past Medical, Surgical Family and Social History reviewed and updated.    Objective:   Today's Vitals   07/11/17 1418  BP: (!) 142/80  Pulse: 85  Resp: 14  Temp: 98.1 F (36.7 C)  TempSrc: Oral  SpO2: 99%  Weight: 144 lb (65.3 kg)  Height: 5\' 2"  (1.575 m)    Wt Readings from Last 3 Encounters:  07/11/17 144 lb (65.3 kg)  06/28/17 144 lb (65.3 kg)  03/19/17 145 lb (65.8 kg)   Physical Exam  Constitutional: She appears well-developed and well-nourished.  HENT:  Head: Normocephalic and atraumatic.  Cardiovascular: Normal rate, regular rhythm, normal heart sounds and intact distal pulses.   Pulmonary/Chest: Effort normal and breath sounds normal.  Abdominal: Hernia confirmed negative in the right inguinal area and confirmed negative in the left inguinal area.  Genitourinary: No breast swelling, tenderness, discharge or bleeding. Pelvic exam was performed with patient supine. No labial fusion. There is no rash, tenderness, lesion or injury on the right labia. There is no rash, tenderness, lesion or injury on the left labia. Vaginal discharge found.  Genitourinary Comments: White discharge noted on exam. Non-odorous   Musculoskeletal: Normal range of motion.  Lymphadenopathy:  Right: No inguinal adenopathy present.       Left: No inguinal adenopathy present.  Neurological: She is alert.  Skin: Skin is warm and dry.  Psychiatric: She has a normal mood and affect. Her behavior is normal. Judgment and thought content normal.   Assessment & Plan:  1. Encounter for well woman exam with routine gynecological exam - Cytology - PAP [place]  RTC: 6 months routine wellness visit and hypertension follow-up. Repeat lipid panel.  [name]. Tiburcio Pea, MSN, FNP-C [facility]  [name] [place], [place] [address] [phone]

## 2017-07-12 LAB — CYTOLOGY - PAP
Bacterial vaginitis: NEGATIVE
Candida vaginitis: POSITIVE — AB
Chlamydia: NEGATIVE
Diagnosis: NEGATIVE
Neisseria Gonorrhea: NEGATIVE
Trichomonas: NEGATIVE

## 2017-07-12 MED ORDER — FLUCONAZOLE 150 MG PO TABS: 150.0000 mg | ORAL_TABLET | Freq: Once | ORAL | 0 refills | Status: AC

## 2017-07-13 MED FILL — FLUCONAZOLE 150 MG TABLET: 150 | 2 days supply | Qty: 2 | Fill #0

## 2017-07-16 ENCOUNTER — Telehealth: Payer: Self-pay

## 2017-07-16 ENCOUNTER — Other Ambulatory Visit: Payer: Self-pay

## 2017-07-16 NOTE — Telephone Encounter (Signed)
Called, no answer. Voicemail did pick up and I left a message advising patient to call back to our office and left call back information. Thanks!

## 2017-07-16 NOTE — Telephone Encounter (Signed)
-----   Message from Bing NeighborsKimberly S Harris, FNP sent at 07/12/2017  7:27 PM EDT ----- Contact patient to advise that her PAP was negative of abnormal cells however was positive for a candidal infection which is consistent with a yeast infection. I am prescribing Diflucan 1 tablet which she may repeat if symptoms are present after 3 days.

## 2017-07-16 NOTE — Telephone Encounter (Signed)
Called, no answer. No voicemail. Will try later.  

## 2017-07-16 NOTE — Telephone Encounter (Signed)
Patient returned call. I advised of negative pap smear besides yeast infection. Advised to take diflucan 1 tablet as a single dose and if symptoms persisted after 3 days should could take 1 tablet again. Patient verbalized understanding. Thanks!

## 2017-07-17 ENCOUNTER — Telehealth: Payer: Self-pay

## 2017-07-17 LAB — CERVICOVAGINAL ANCILLARY ONLY: HERPES (WINDOWPATH): NEGATIVE

## 2017-07-17 NOTE — Telephone Encounter (Signed)
Patient states that the diflucan was not at pharmacy and also needs refill on Lisinopril

## 2017-07-17 NOTE — Telephone Encounter (Signed)
[facility] Please find put if she would like them sent somewhere else. Uncheck current meds to see that medication was e-prescribed 8/31/201/8.  If she would like medication sent elsewhere ok to reorder and will cosign

## 2017-07-23 ENCOUNTER — Telehealth: Payer: Self-pay

## 2017-07-23 MED ORDER — LISINOPRIL 20 MG PO TABS: 20.0000 mg | ORAL_TABLET | Freq: Every day | ORAL | 3 refills | Status: DC

## 2017-07-23 NOTE — Telephone Encounter (Signed)
Refill for lisinopril sent into pharmacy. Thanks!  

## 2017-10-17 ENCOUNTER — Telehealth: Payer: Self-pay

## 2017-10-17 MED ORDER — LISINOPRIL 20 MG PO TABS: 20.0000 mg | ORAL_TABLET | Freq: Every day | ORAL | 1 refills | Status: DC

## 2017-10-17 NOTE — Telephone Encounter (Signed)
Left a vm for patient to callback 

## 2017-12-02 ENCOUNTER — Encounter: Payer: Self-pay | Admitting: Family Medicine

## 2017-12-02 ENCOUNTER — Ambulatory Visit (INDEPENDENT_AMBULATORY_CARE_PROVIDER_SITE_OTHER): Payer: Self-pay | Admitting: Family Medicine

## 2017-12-02 VITALS — BP 142/78 | HR 84 | Temp 98.1°F | Resp 14 | Ht 62.0 in | Wt 148.0 lb

## 2017-12-02 DIAGNOSIS — J0191 Acute recurrent sinusitis, unspecified: Secondary | ICD-10-CM

## 2017-12-02 MED ORDER — IPRATROPIUM BROMIDE 0.03 % NA SOLN: 2.0000 | Freq: Two times a day (BID) | NASAL | 0 refills | Status: DC

## 2017-12-02 MED ORDER — PROMETHAZINE-CODEINE 6.25-10 MG/5ML PO SYRP: 5.0000 mL | ORAL_SOLUTION | Freq: Four times a day (QID) | ORAL | 0 refills | Status: DC | PRN

## 2017-12-02 MED ORDER — AMOXICILLIN-POT CLAVULANATE 875-125 MG PO TABS: 1.0000 | ORAL_TABLET | Freq: Two times a day (BID) | ORAL | 0 refills | Status: DC

## 2017-12-02 NOTE — Patient Instructions (Signed)

## 2017-12-02 NOTE — Progress Notes (Signed)
Sharon Christensen  08/17/1961  [id number]  Chief Complaint  Patient presents with  . Cough  . Nasal Congestion  . Sore Throat    Subjective:  Sharon Christensen is a 57 y.o. female with hypertension and current tobacco use, presents for evaluation of possible sinusitis.  Symptoms include facial pain, nasal congestion, sinus pressure, sinus pain, sneezing, chest tightness, wheezing, and  coughing. Onset of symptoms was 2 weeks ago, and has been improved initially and recurred with worsening symptoms. Treatment to date:  ibuprofen.  High risk factors for influenza complications:  current chronic smoking.    Family History  Problem Relation Age of Onset  . Diabetes Mother   . Cancer Father   . Diabetes Maternal Aunt     Social History   Socioeconomic History  . Marital status: Single    Spouse name: Not on file  . Number of children: Not on file  . Years of education: Not on file  . Highest education level: Not on file  Social Needs  . Financial resource strain: Not on file  . Food insecurity - worry: Not on file  . Food insecurity - inability: Not on file  . Transportation needs - medical: Not on file  . Transportation needs - non-medical: Not on file  Occupational History  . Not on file  Tobacco Use  . Smoking status: Current Every Day Smoker    Packs/day: 0.50    Types: Cigarettes  . Smokeless tobacco: Never Used  Substance and Sexual Activity  . Alcohol use: Yes    Alcohol/week: 1.2 oz    Types: 2 Cans of beer per week  . Drug use: No  . Sexual activity: Not on file  Other Topics Concern  . Not on file  Social History Narrative  . Not on file   The following portions of the patient's history were reviewed and updated as appropriate:  allergies, current medications and past medical history.  Constitutional: positive for fatigue Ears, nose, mouth, throat, and face: positive for nasal congestion Respiratory: positive for cough and pleurisy/chest pain Cardiovascular:  negative Objective:   Vitals:   12/02/17 1007 12/02/17 1013  BP: (!) 160/80 (!) 142/78  Pulse: 84   Resp: 14   Temp: 98.1 F (36.7 C)   SpO2: 100%    Physical Exam  Constitutional: She is oriented to person, place, and time and well-developed, well-nourished, and in no distress.  HENT:  Head: Normocephalic and atraumatic.  Nose: Mucosal edema and rhinorrhea present. Right sinus exhibits frontal sinus tenderness. Left sinus exhibits frontal sinus tenderness.  Mouth/Throat: Oropharynx is clear and moist. No oropharyngeal exudate.  Eyes: Conjunctivae and EOM are normal. Pupils are equal, round, and reactive to light.  Neck: Normal range of motion. Neck supple. No thyromegaly present.  Cardiovascular: Normal rate, regular rhythm, normal heart sounds and intact distal pulses.  Pulmonary/Chest: Effort normal and breath sounds normal.  Musculoskeletal: Normal range of motion.  Lymphadenopathy:    She has no cervical adenopathy.  Neurological: She is alert and oriented to person, place, and time.  Skin: Skin is warm and dry.  Psychiatric: Mood, memory, affect and judgment normal.   Assessment:  sinusitis, uncomplicated, patient is stable. No evidence of influenza or pneumonia. Treating empirically with a broad-spectrum antibiotic     Plan:   Meds ordered this encounter  Medications  . amoxicillin-clavulanate (AUGMENTIN) 875-125 MG tablet    Sig: Take 1 tablet by mouth 2 (two) times daily.    Dispense:  20  tablet    Refill:  0    Order Specific Question:   Supervising Provider    Answer:   Quentin Angst [id number]  . ipratropium (ATROVENT) 0.03 % nasal spray    Sig: Place 2 sprays into both nostrils 2 (two) times daily.    Dispense:  30 mL    Refill:  0    Order Specific Question:   Supervising Provider    Answer:   Quentin Angst [id number]  . promethazine-codeine (PHENERGAN WITH CODEINE) 6.25-10 MG/5ML syrup    Sig: Take 5 mLs by mouth every 6 (six) hours as  needed for cough.    Dispense:  120 mL    Refill:  0    Order Specific Question:   Supervising Provider    Answer:   Quentin Angst [id number]   If no improvement, return for care.  RTC: Next scheduled appointment on file.   [name]. Tiburcio Pea, MSN, FNP-C [facility]  [name] [place], [place] [address] [phone]

## 2018-01-10 ENCOUNTER — Ambulatory Visit (INDEPENDENT_AMBULATORY_CARE_PROVIDER_SITE_OTHER): Payer: Self-pay | Admitting: Family Medicine

## 2018-01-10 ENCOUNTER — Encounter: Payer: Self-pay | Admitting: Family Medicine

## 2018-01-10 VITALS — BP 146/79 | HR 88 | Temp 98.2°F | Resp 16 | Ht 62.0 in | Wt 145.6 lb

## 2018-01-10 DIAGNOSIS — E785 Hyperlipidemia, unspecified: Secondary | ICD-10-CM

## 2018-01-10 DIAGNOSIS — I10 Essential (primary) hypertension: Secondary | ICD-10-CM

## 2018-01-10 LAB — POCT URINALYSIS DIP (DEVICE)
Bilirubin Urine: NEGATIVE
Glucose, UA: NEGATIVE mg/dL
Hgb urine dipstick: NEGATIVE
KETONES UR: NEGATIVE mg/dL
Leukocytes, UA: NEGATIVE
Nitrite: NEGATIVE
Protein, ur: NEGATIVE mg/dL
Specific Gravity, Urine: >=1.03 (ref 1.005–1.030)
Urobilinogen, UA: 0.2 mg/dL (ref 0.0–1.0)
pH: 5 (ref 5.0–8.0)

## 2018-01-10 MED ORDER — LISINOPRIL 40 MG PO TABS: 40.0000 mg | ORAL_TABLET | Freq: Every day | ORAL | 2 refills | Status: DC

## 2018-01-10 NOTE — Patient Instructions (Signed)
I have increased your lisinopril 40 mg once daily.  To schedule your breast exam, please call  Bre[facility] at   Phone: [phone]

## 2018-01-10 NOTE — Progress Notes (Signed)
Patient ID: Sharon Christensen, female    DOB: 06-Mar-1961, 57 y.o.   MRN: [id number]  PCP: Bing Neighbors, FNP  Chief Complaint  Patient presents with  . Follow-up    HTN    Subjective:  HPI Sharon Christensen is a 57 y.o. female presents for evaluation of hypertension and fasting lipid panel. No routine home monitoring of blood pressure. Reports consistent adherence to current prescribed blood pressure regimen. Inconsistent adherence to low sodium diet. She is a current everyday smoker and currently is not interested in quitting. Last lipid panel collected in August revealed total cholesterol 222 and LDL 133 and she has taken fish oil since that time. She remains inactive of exercise. Denies any episodes of dizziness,headaches, shortness of breath, or chest pain. Social History   Socioeconomic History  . Marital status: Single    Spouse name: Not on file  . Number of children: Not on file  . Years of education: Not on file  . Highest education level: Not on file  Social Needs  . Financial resource strain: Not on file  . Food insecurity - worry: Not on file  . Food insecurity - inability: Not on file  . Transportation needs - medical: Not on file  . Transportation needs - non-medical: Not on file  Occupational History  . Not on file  Tobacco Use  . Smoking status: Current Every Day Smoker    Packs/day: 0.50    Types: Cigarettes  . Smokeless tobacco: Never Used  Substance and Sexual Activity  . Alcohol use: Yes    Alcohol/week: 1.2 oz    Types: 2 Cans of beer per week  . Drug use: No  . Sexual activity: Not on file  Other Topics Concern  . Not on file  Social History Narrative  . Not on file    Family History  Problem Relation Age of Onset  . Diabetes Mother   . Cancer Father   . Diabetes Maternal Aunt      Review of Systems Pertinent negatives listed in HPI There are no active problems to display for this patient.   No Known Allergies  Prior to Admission  medications   Medication Sig Start Date End Date Taking? Authorizing Provider  ipratropium (ATROVENT) 0.03 % nasal spray Place 2 sprays into both nostrils 2 (two) times daily. 12/02/17  Yes Bing Neighbors, FNP  lisinopril (PRINIVIL,ZESTRIL) 20 MG tablet Take 1 tablet (20 mg total) by mouth daily. 10/17/17  Yes Bing Neighbors, FNP  Omega-3 Fatty Acids (FISH OIL) 1000 MG CAPS Take 3 capsules (3,000 mg total) by mouth daily. 07/01/17  Yes Bing Neighbors, FNP  pseudoephedrine (SUDAFED) 30 MG tablet Take 30 mg by mouth every 4 (four) hours as needed for congestion.   Yes [provider]  amoxicillin-clavulanate (AUGMENTIN) 875-125 MG tablet Take 1 tablet by mouth 2 (two) times daily. 12/02/17   Bing Neighbors, FNP  diphenhydrAMINE (BENADRYL) 25 MG tablet Take 1 tablet (25 mg total) by mouth every 6 (six) hours as needed for itching or allergies. Patient not taking: Reported on 12/02/2017 03/02/17   [name], PA-C  promethazine-codeine [facility] WITH CODEINE) 6.25-10 MG/5ML syrup Take 5 mLs by mouth every 6 (six) hours as needed for cough. 12/02/17   Bing Neighbors, FNP    Past Medical, Surgical Family and Social History reviewed and updated.    Objective:   Today's Vitals   01/10/18 1333  BP: (!) 146/79  Pulse: 88  Resp:  16  Temp: 98.2 F (36.8 C)  TempSrc: Oral  SpO2: 100%  Weight: 145 lb 9.6 oz (66 kg)  Height: 5\' 2"  (1.575 m)    Wt Readings from Last 3 Encounters:  01/10/18 145 lb 9.6 oz (66 kg)  12/02/17 148 lb (67.1 kg)  07/11/17 144 lb (65.3 kg)    Physical Exam  Constitutional: She is oriented to person, place, and time. She appears well-developed and well-nourished.  HENT:  Head: Normocephalic and atraumatic.  Right Ear: External ear normal.  Left Ear: External ear normal.  Mouth/Throat: Oropharynx is clear and moist.  Eyes: Conjunctivae and EOM are normal. Pupils are equal, round, and reactive to light.  Neck: Normal range of motion. Neck  supple. No thyromegaly present.  Cardiovascular: Normal rate, regular rhythm, normal heart sounds and intact distal pulses.  Pulmonary/Chest: Effort normal and breath sounds normal.  Musculoskeletal: Normal range of motion.  Lymphadenopathy:    She has no cervical adenopathy.  Neurological: She is alert and oriented to person, place, and time.  Skin: Skin is warm.  Psychiatric: She has a normal mood and affect. Her behavior is normal. Judgment and thought content normal.           Assessment & Plan:  1. Hyperlipidemia, unspecified hyperlipidemia type, checking fasting lipid panel. If abnormal will start statin therapy.  The 10-year ASCVD risk score Denman George(Goff DC Montez HagemanJr., et al., 2013) is: 7.6%   Values used to calculate the score:     Age: [age] years     Sex: Female     Is Non-Hispanic African American: No     Diabetic: No     Tobacco smoker: Yes     Systolic Blood Pressure: 146 mmHg     Is BP treated: Yes     HDL Cholesterol: 76 mg/dL     Total Cholesterol: 208 mg/dL - Lipid panel  2. Essential hypertension, elevated. Increasing Lisinopril 40 mg once daily. We have discussed target BP range and blood pressure goal. I have advised patient to check BP regularly and to call us back or report to clinic if the numbers are consistently higher than 140/90. We discussed the importance of compliance with medical therapy and DASH diet recommended, consequences of uncontrolled hypertension discussed.       Meds ordered this encounter  Medications  . lisinopril (PRINIVIL,ZESTRIL) 40 MG tablet    Sig: Take 1 tablet (40 mg total) by mouth daily.    Dispense:  90 tablet    Refill:  2    Order Specific Question:   Supervising Provider    Answer:   Quentin AngstJEGEDE, OLUGBEMIGA E [id number]]    Orders Placed This Encounter  Procedures  . Lipid panel  . POCT urinalysis dip (device)    RTC: 3 months for chronic conditions     Godfrey PickKimberly S. Tiburcio PeaHarris, MSN, FNP-C [facility]  [address] [place], [place] [id number] [phone]

## 2018-01-11 LAB — LIPID PANEL
CHOL/HDL RATIO: 2.7 ratio (ref 0.0–4.4)
Cholesterol, Total: 208 mg/dL — ABNORMAL HIGH (ref 100–199)
HDL: 76 mg/dL (ref >39)
LDL Calculated: 110 mg/dL — ABNORMAL HIGH (ref 0–99)
Triglycerides: 109 mg/dL (ref 0–149)
VLDL CHOLESTEROL CAL: 22 mg/dL (ref 5–40)

## 2018-01-15 ENCOUNTER — Telehealth: Payer: Self-pay

## 2018-01-15 MED ORDER — ATORVASTATIN CALCIUM 40 MG PO TABS: 40.0000 mg | ORAL_TABLET | Freq: Every day | ORAL | 3 refills | Status: DC

## 2018-01-16 NOTE — Telephone Encounter (Signed)
Medication resent

## 2018-02-21 ENCOUNTER — Ambulatory Visit (INDEPENDENT_AMBULATORY_CARE_PROVIDER_SITE_OTHER): Payer: Self-pay | Admitting: Family Medicine

## 2018-02-21 VITALS — BP 130/78 | HR 74 | Ht 62.0 in | Wt 145.0 lb

## 2018-02-21 DIAGNOSIS — Z0130 Encounter for examination of blood pressure without abnormal findings: Secondary | ICD-10-CM

## 2018-02-21 NOTE — Progress Notes (Signed)
Patient blood pressure was good and patient was advise to continue current treatment and keep follow up.

## 2018-05-10 IMAGING — CR DG FOOT COMPLETE 3+V*L*
3 series · 3 of 3 positions shown · non-contrast
Comparison: None

CLINICAL DATA: Fell down some steps last night, pain, bruising and
swelling at dorsal aspect of LEFT foot over the metatarsal bases and
the tarsals

EXAM:
LEFT FOOT - COMPLETE 3+ VIEW

[t foot ap left]
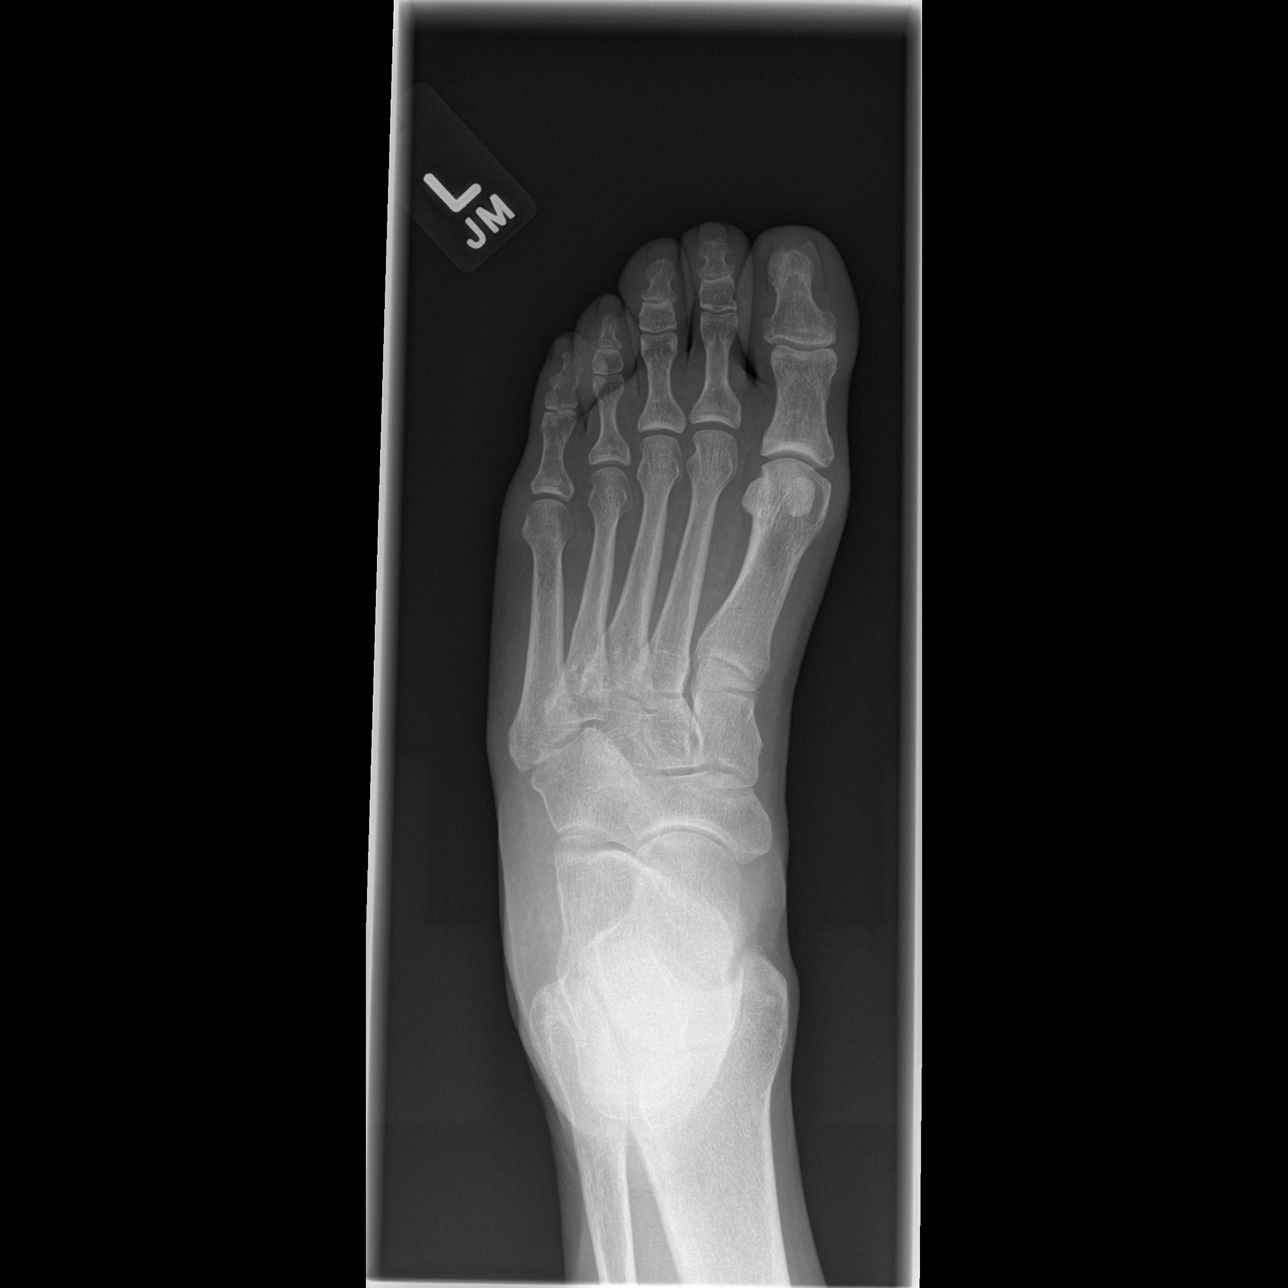

[t foot oblique left]
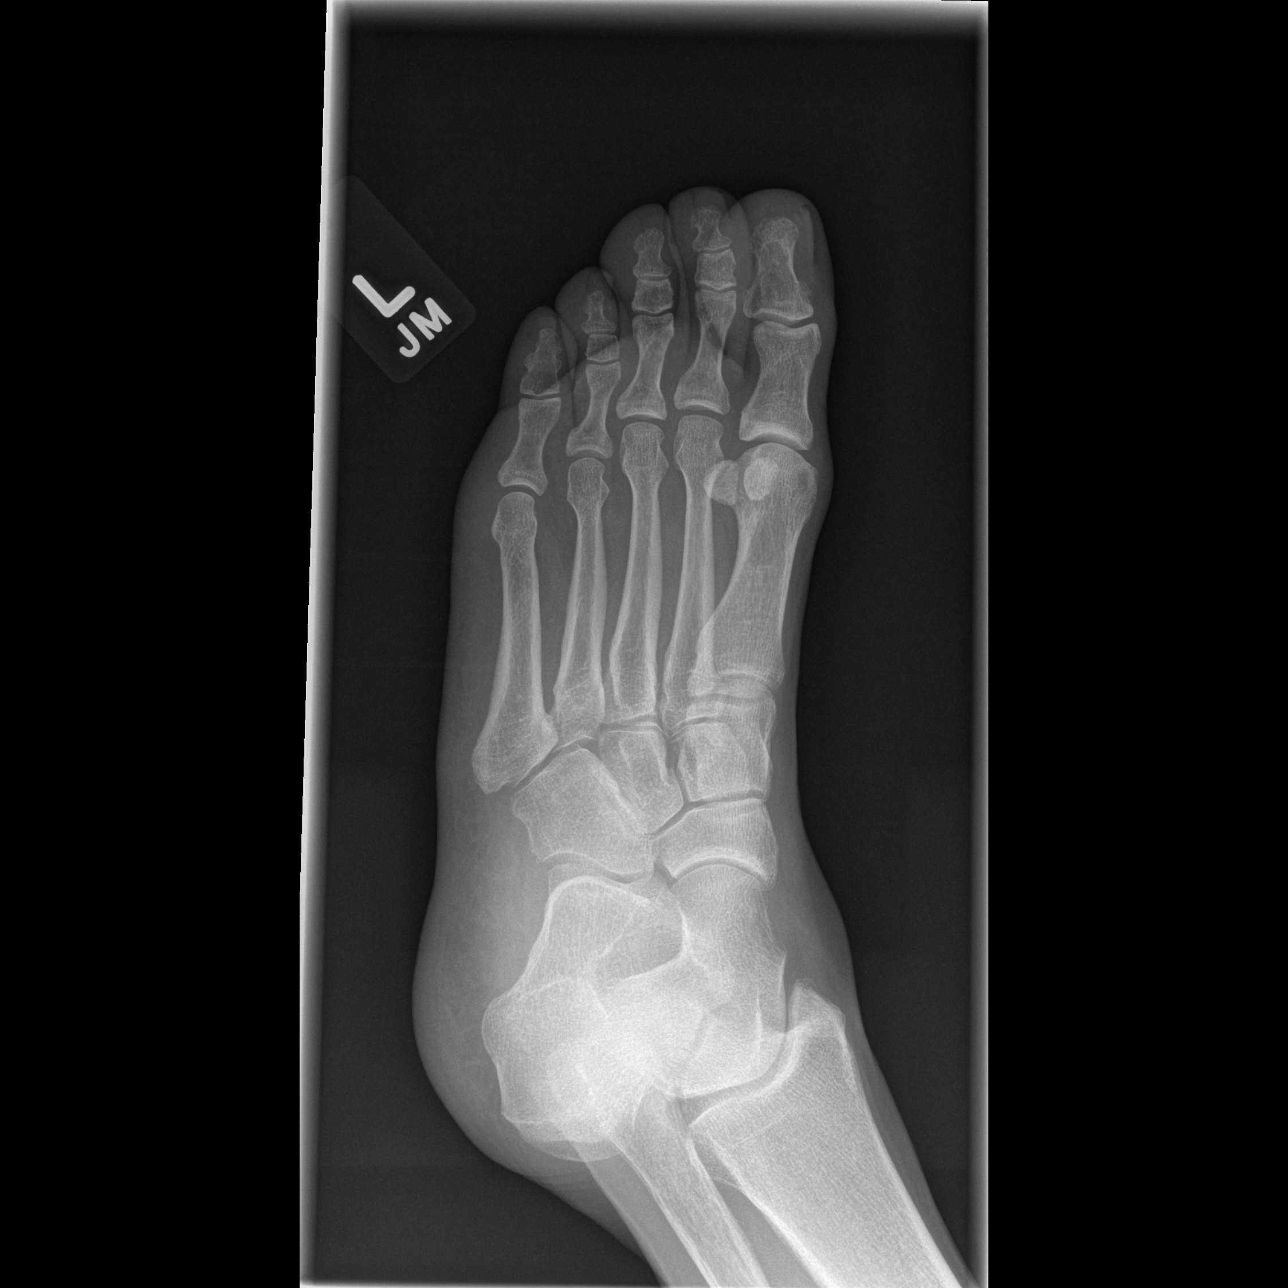

[t foot lat left]
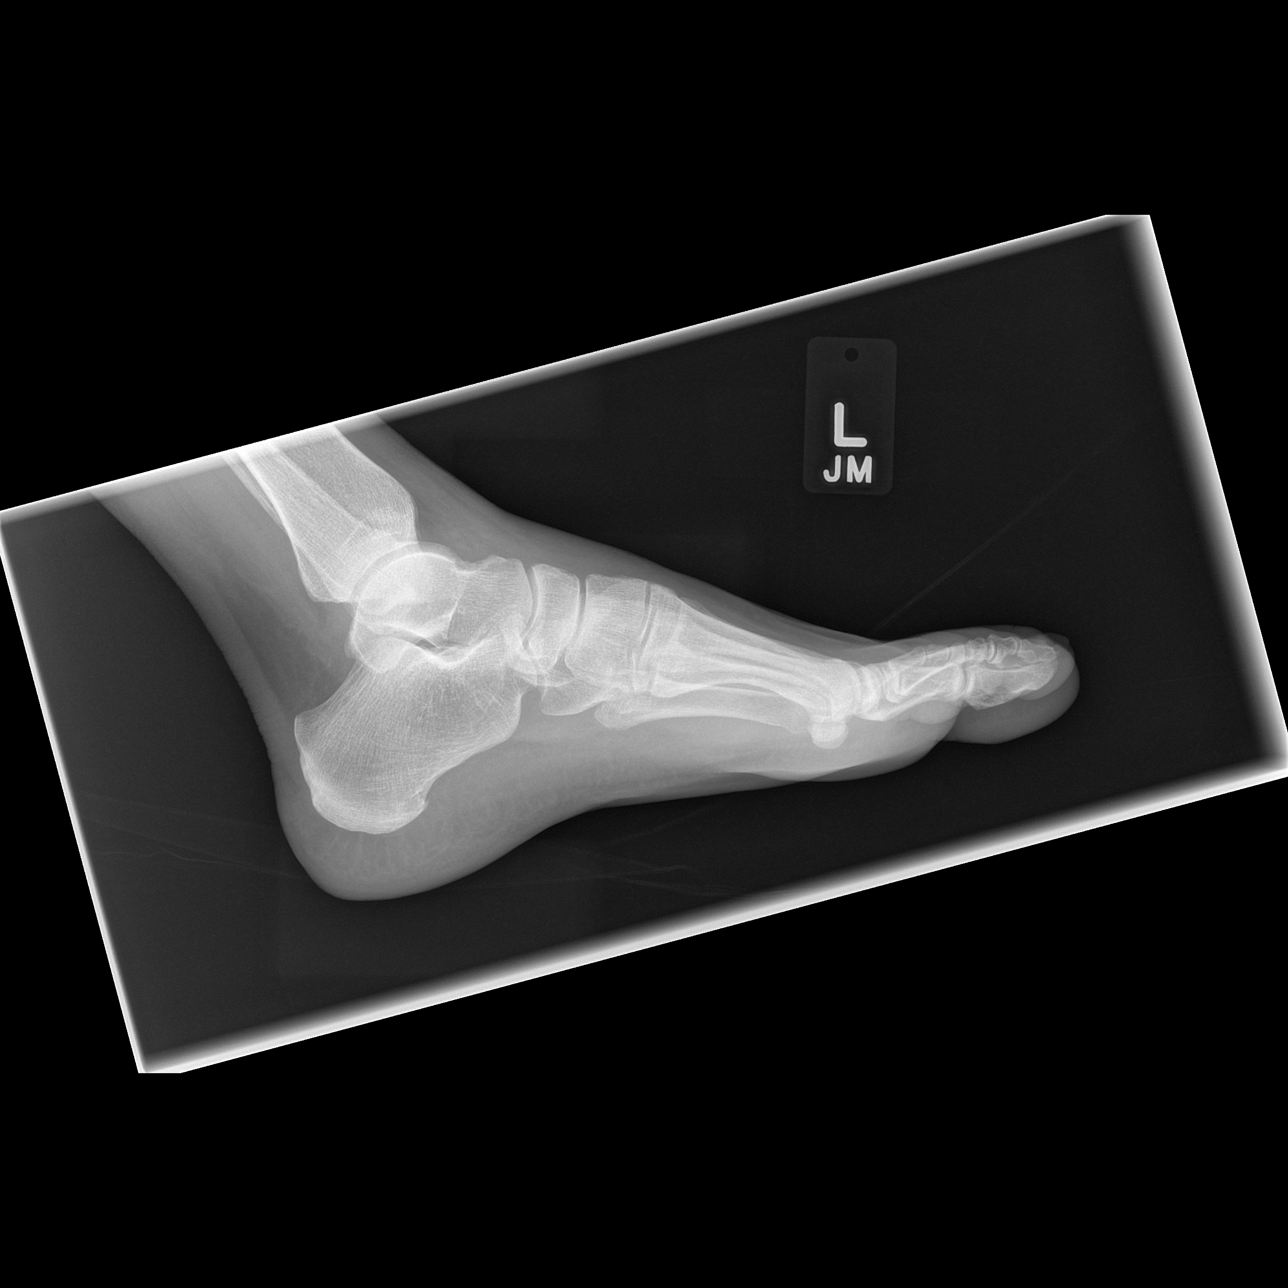

[3 of 3 positions shown; findings below may reference images not displayed]

FINDINGS: Osseous mineralization normal.

Joint spaces preserved.

No acute fracture, dislocation, or bone destruction.

Mild dorsal soft tissue swelling at midfoot.
IMPRESSION: No acute osseous abnormalities.

## 2018-05-10 IMAGING — CR DG ANKLE COMPLETE 3+V*L*
3 series · 3 of 3 positions shown · non-contrast
Comparison: None.

CLINICAL DATA: Fall down steps yesterday with ankle pain, initial
encounter

EXAM:
LEFT ANKLE COMPLETE - 3+ VIEW

[t ankle joint ap left]
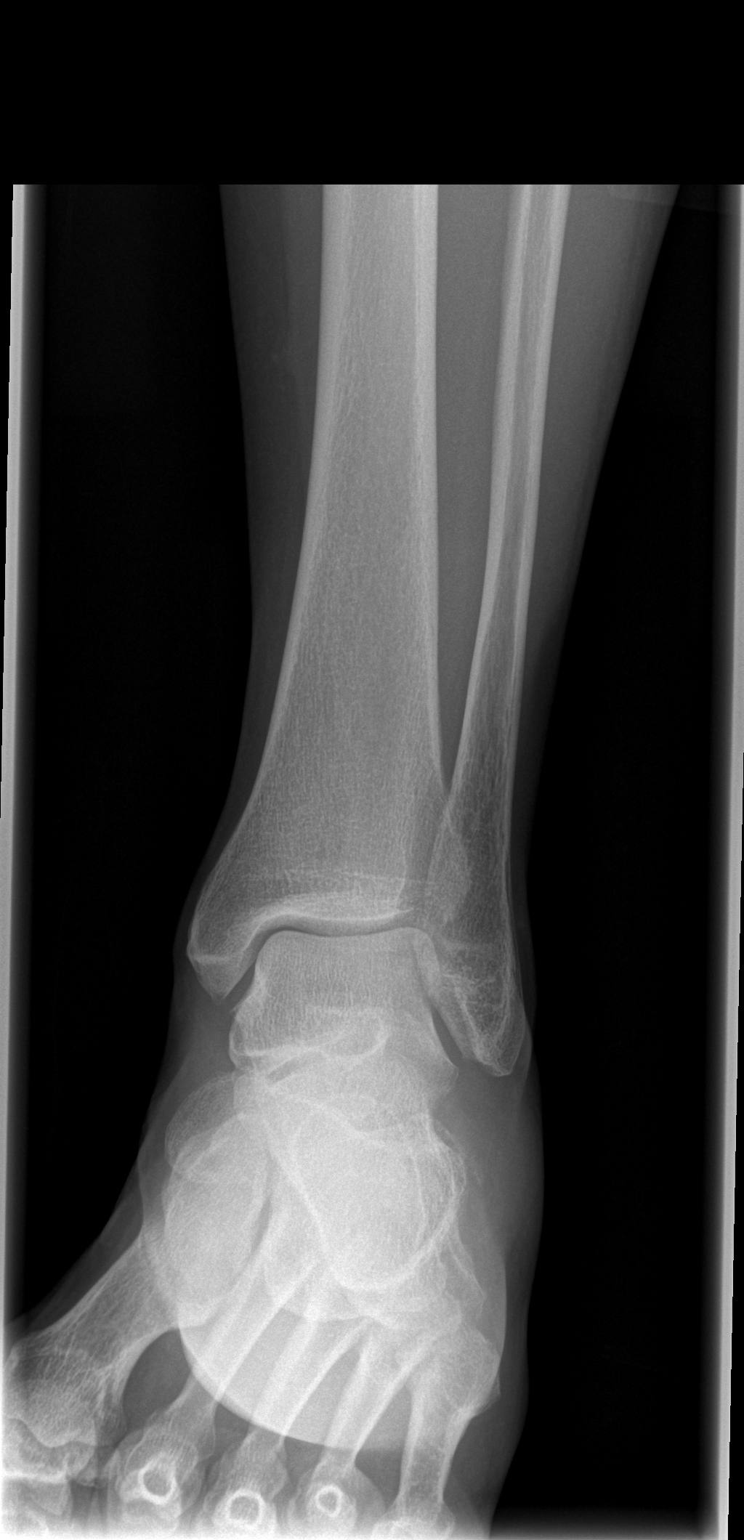

[t ankle joint oblique left]
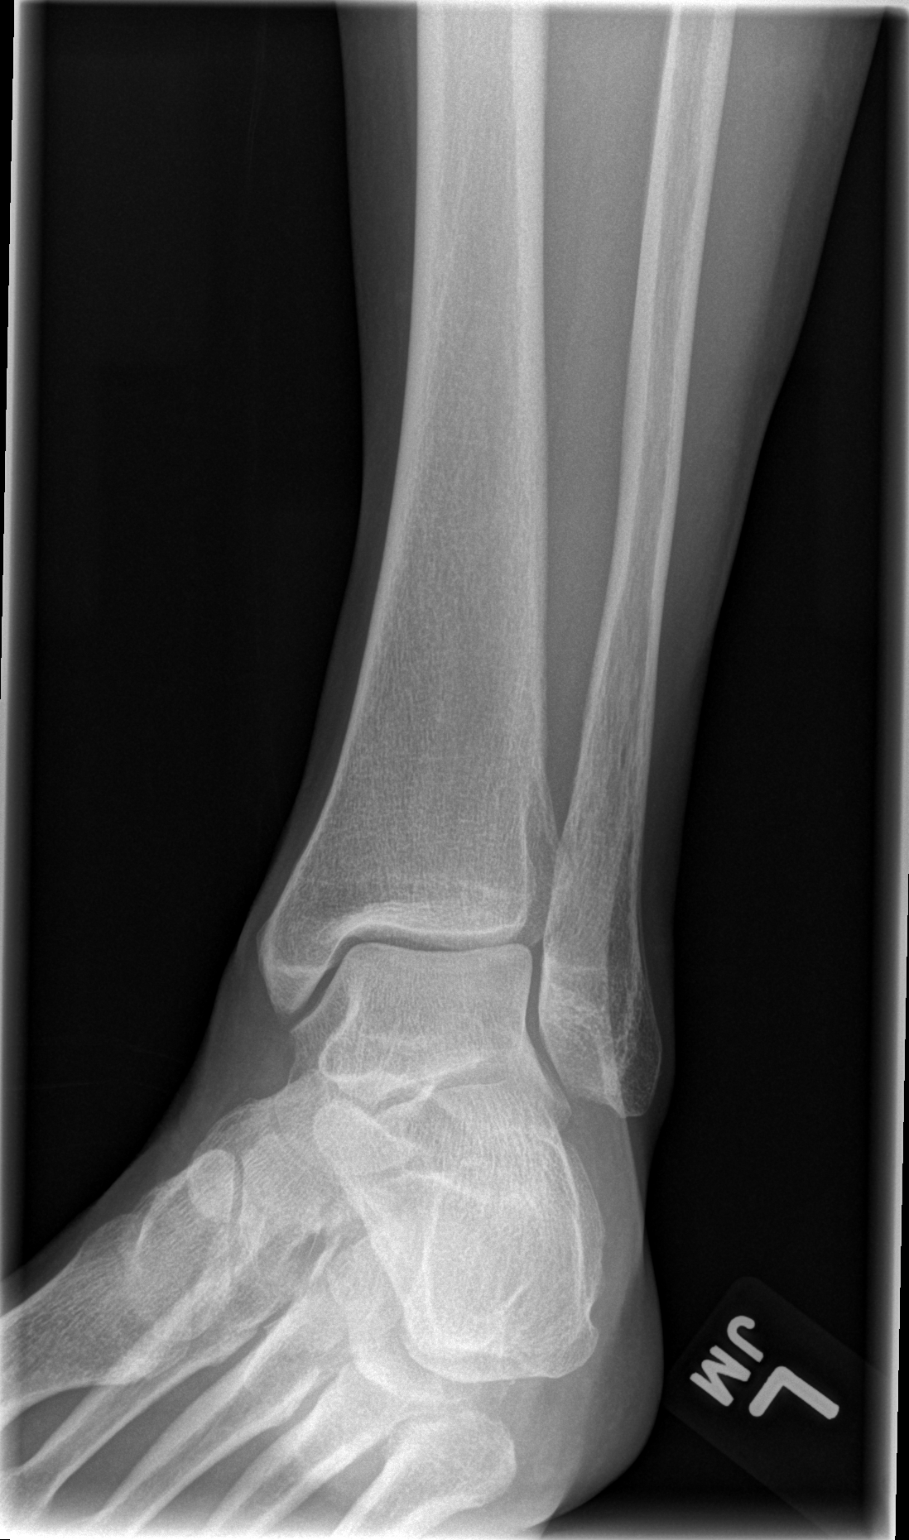

[t ankle joint lat left]
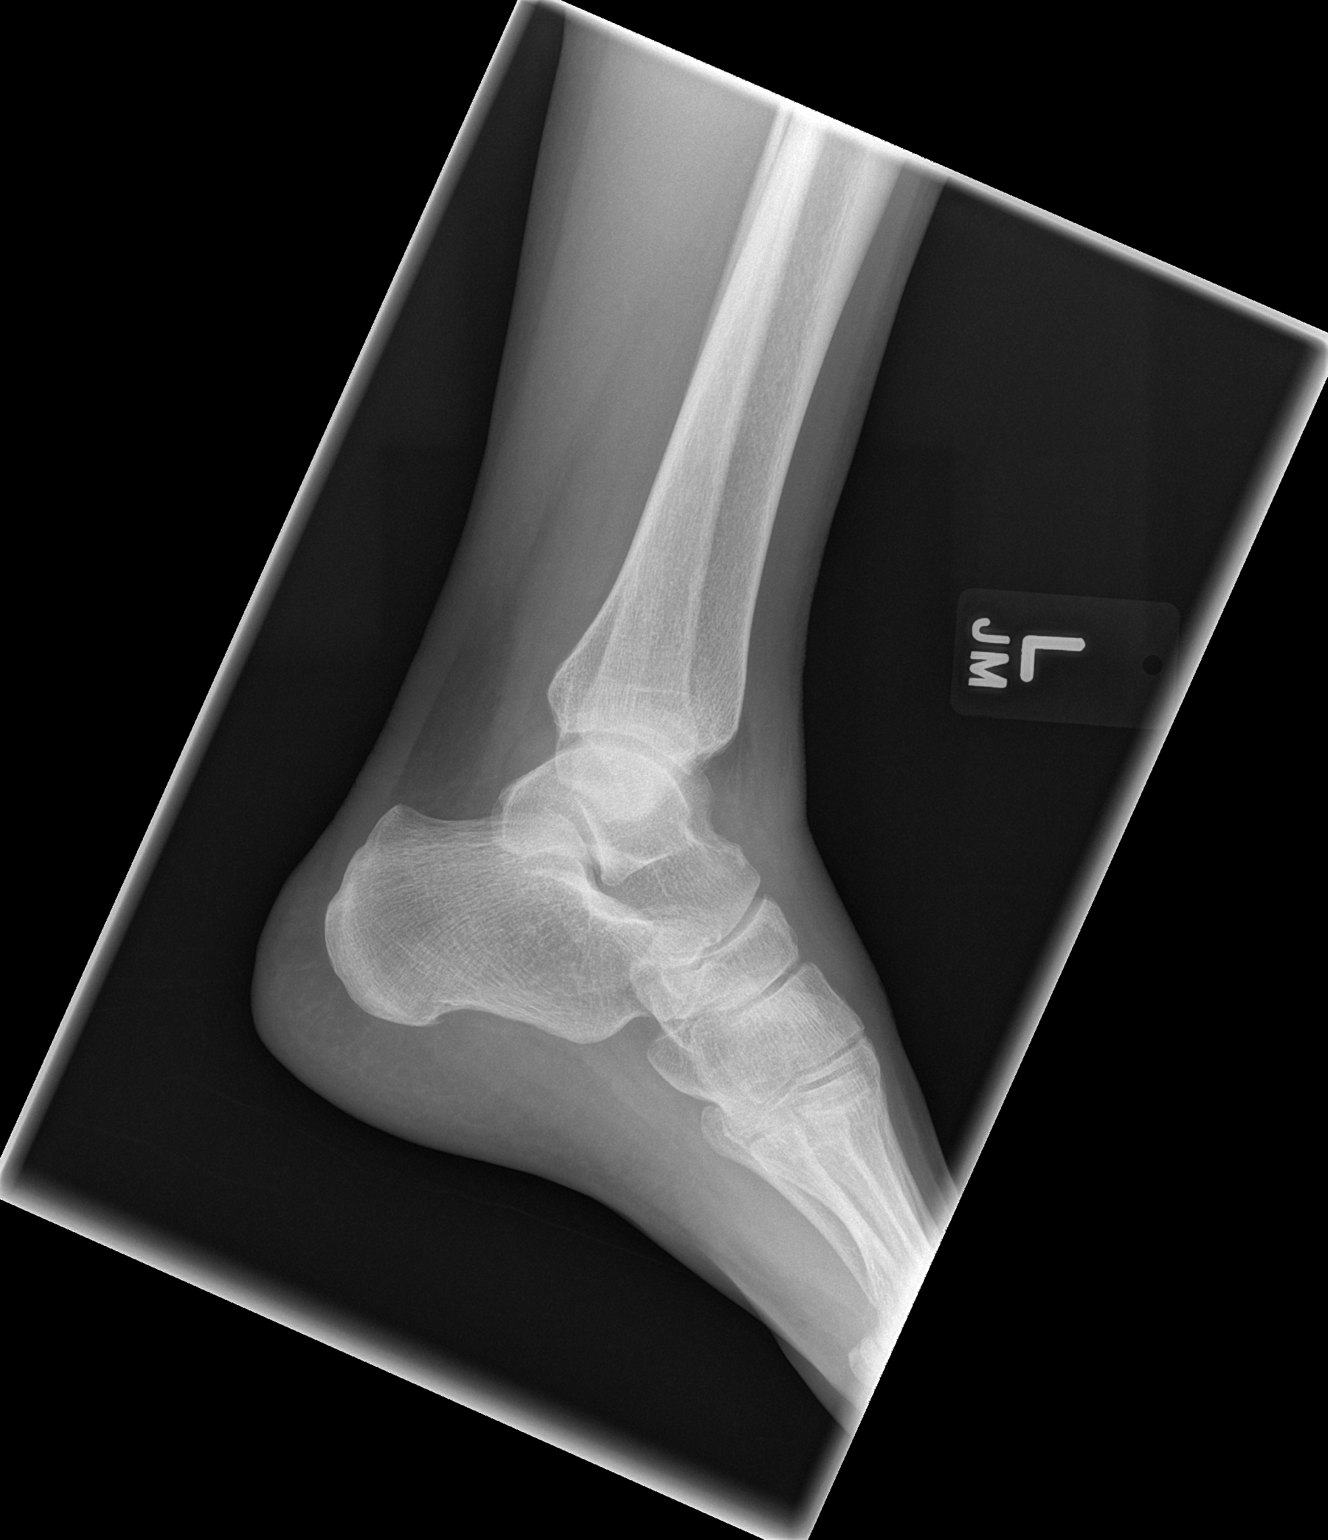

[3 of 3 positions shown; findings below may reference images not displayed]

FINDINGS: There is no evidence of fracture, dislocation, or joint effusion.
There is no evidence of arthropathy or other focal bone abnormality.
Soft tissues are unremarkable.
IMPRESSION: Negative.

## 2018-09-01 ENCOUNTER — Telehealth: Payer: Self-pay

## 2018-09-01 NOTE — Telephone Encounter (Signed)
Patient will schedule appointment to discuss medication

## 2018-09-02 ENCOUNTER — Encounter: Payer: Self-pay | Admitting: Family Medicine

## 2018-09-02 ENCOUNTER — Ambulatory Visit (INDEPENDENT_AMBULATORY_CARE_PROVIDER_SITE_OTHER): Payer: Self-pay | Admitting: Family Medicine

## 2018-09-02 VITALS — BP 166/90 | HR 84 | Temp 98.9°F | Ht 62.0 in | Wt 140.4 lb

## 2018-09-02 DIAGNOSIS — I10 Essential (primary) hypertension: Secondary | ICD-10-CM

## 2018-09-02 DIAGNOSIS — Z09 Encounter for follow-up examination after completed treatment for conditions other than malignant neoplasm: Secondary | ICD-10-CM

## 2018-09-02 DIAGNOSIS — Z131 Encounter for screening for diabetes mellitus: Secondary | ICD-10-CM

## 2018-09-02 DIAGNOSIS — Z0000 Encounter for general adult medical examination without abnormal findings: Secondary | ICD-10-CM

## 2018-09-02 DIAGNOSIS — Z1239 Encounter for other screening for malignant neoplasm of breast: Secondary | ICD-10-CM

## 2018-09-02 LAB — POCT URINALYSIS DIP (MANUAL ENTRY)
Bilirubin, UA: NEGATIVE
Blood, UA: NEGATIVE
Glucose, UA: NEGATIVE mg/dL
Ketones, POC UA: NEGATIVE mg/dL
Leukocytes, UA: NEGATIVE
Nitrite, UA: NEGATIVE
Protein Ur, POC: NEGATIVE mg/dL
Spec Grav, UA: 1.02 (ref 1.010–1.025)
Urobilinogen, UA: 0.2 E.U./dL
pH, UA: 7 (ref 5.0–8.0)

## 2018-09-02 LAB — POCT GLYCOSYLATED HEMOGLOBIN (HGB A1C): Hemoglobin A1C: 5.4 % (ref 4.0–5.6)

## 2018-09-02 MED ORDER — AMLODIPINE BESYLATE 10 MG PO TABS
10.0000 mg | ORAL_TABLET | Freq: Every day | ORAL | 1 refills | Status: DC
Start: 2018-09-02 — End: 2018-09-24

## 2018-09-02 NOTE — Patient Instructions (Signed)
Amlodipine tablets °What is this medicine? °AMLODIPINE (am LOE di peen) is a calcium-channel blocker. It affects the amount of calcium found in your heart and muscle cells. This relaxes your blood vessels, which can reduce the amount of work the heart has to do. This medicine is used to lower high blood pressure. It is also used to prevent chest pain. °This medicine may be used for other purposes; ask your health care provider or pharmacist if you have questions. °COMMON BRAND NAME(S): Norvasc °What should I tell my health care provider before I take this medicine? °They need to know if you have any of these conditions: °-heart problems like heart failure or aortic stenosis °-liver disease °-an unusual or allergic reaction to amlodipine, other medicines, foods, dyes, or preservatives °-pregnant or trying to get pregnant °-breast-feeding °How should I use this medicine? °Take this medicine by mouth with a glass of water. Follow the directions on the prescription label. Take your medicine at regular intervals. Do not take more medicine than directed. °Talk to your pediatrician regarding the use of this medicine in children. Special care may be needed. This medicine has been used in children as young as 6. °Persons over 65 years old may have a stronger reaction to this medicine and need smaller doses. °Overdosage: If you think you have taken too much of this medicine contact a poison control center or emergency room at once. °NOTE: This medicine is only for you. Do not share this medicine with others. °What if I miss a dose? °If you miss a dose, take it as soon as you can. If it is almost time for your next dose, take only that dose. Do not take double or extra doses. °What may interact with this medicine? °-herbal or dietary supplements °-local or general anesthetics °-medicines for high blood pressure °-medicines for prostate problems °-rifampin °This list may not describe all possible interactions. Give your health  care provider a list of all the medicines, herbs, non-prescription drugs, or dietary supplements you use. Also tell them if you smoke, drink alcohol, or use illegal drugs. Some items may interact with your medicine. °What should I watch for while using this medicine? °Visit your doctor or health care professional for regular check ups. Check your blood pressure and pulse rate regularly. Ask your health care professional what your blood pressure and pulse rate should be, and when you should contact him or her. °This medicine may make you feel confused, dizzy or lightheaded. Do not drive, use machinery, or do anything that needs mental alertness until you know how this medicine affects you. To reduce the risk of dizzy or fainting spells, do not sit or stand up quickly, especially if you are an older patient. Avoid alcoholic drinks; they can make you more dizzy. °Do not suddenly stop taking amlodipine. Ask your doctor or health care professional how you can gradually reduce the dose. °What side effects may I notice from receiving this medicine? °Side effects that you should report to your doctor or health care professional as soon as possible: °-allergic reactions like skin rash, itching or hives, swelling of the face, lips, or tongue °-breathing problems °-changes in vision or hearing °-chest pain °-fast, irregular heartbeat °-swelling of legs or ankles °Side effects that usually do not require medical attention (report to your doctor or health care professional if they continue or are bothersome): °-dry mouth °-facial flushing °-nausea, vomiting °-stomach gas, pain °-tired, weak °-trouble sleeping °This list may not describe all possible side   effects. Call your doctor for medical advice about side effects. You may report side effects to FDA at 1-800-FDA-1088. °Where should I keep my medicine? °Keep out of the reach of children. °Store at room temperature between 59 and 86 degrees F (15 and 30 degrees C). Protect from  light. Keep container tightly closed. Throw away any unused medicine after the expiration date. °NOTE: This sheet is a summary. It may not cover all possible information. If you have questions about this medicine, talk to your doctor, pharmacist, or health care provider. °© 2018 [employer]/Gold Standard (2012-09-26 11:40:58) ° °

## 2018-09-02 NOTE — Progress Notes (Signed)
Follow Up  Subjective:    Patient ID: Sharon Christensen, female    DOB: 04-Feb-1961, 57 y.o.   MRN: [id number]     HPI  Sharon Christensen is a 57 year old female with a past medical history of Hypertension and Hyperlipidemia. She is here today for follow up and assessment of chronic diseases.   Current Status: Since her last office visit, she is doing well with no complaints. She states that she stopped taking Lisinopril about 4 days ago because of kidney pain and cough. She has had a recent death in the family and has mild anxiety today. Denies suicidal ideations, homicidal ideations, or auditory hallucinations. She denies visual changes, chest pain, cough, shortness of breath, heart palpitations, and falls. she has occasionally headaches and dizziness with position changes. Denies severe headaches, confusion, seizures, double vision, and blurred vision, nausea and vomiting.  She denies fevers, chills, fatigue, recent infections, weight loss, and night sweats. No reports of GI problems such as nausea, vomiting, diarrhea, and constipation. She has no reports of blood in stools, dysuria and hematuria. She denies pain today.   Review of Systems  Constitutional: Negative.   HENT: Negative.   Eyes: Negative.   Respiratory: Negative.   Cardiovascular: Negative.   Genitourinary: Negative.   Musculoskeletal: Negative.   Neurological: Negative.   Psychiatric/Behavioral: Negative.    Objective:   Physical Exam  Constitutional: She is oriented to person, place, and time. She appears well-nourished.  HENT:  Head: Normocephalic and atraumatic.  Right Ear: External ear normal.  Left Ear: External ear normal.  Nose: Nose normal.  Mouth/Throat: Oropharynx is clear and moist.  Neck: Normal range of motion. Neck supple.  Cardiovascular: Normal rate, regular rhythm, normal heart sounds and intact distal pulses.  Pulmonary/Chest: Effort normal and breath sounds normal.  Abdominal: Soft. Bowel sounds are normal.   Musculoskeletal: Normal range of motion.  Neurological: She is alert and oriented to person, place, and time.  Skin: Skin is warm and dry.  Psychiatric: She has a normal mood and affect. Her behavior is normal. Judgment and thought content normal.  Nursing note and vitals reviewed.  Assessment & Plan:   1. Essential hypertension Blood pressure is elevated today. She has discontinued Lisinopril. We will initiate Norvasc today and re-assess at her next office visit in 2 weeks.   She will continue to decrease high sodium intake, excessive alcohol intake, increase potassium intake, smoking cessation, and increase physical activity of at least 30 minutes of cardio activity daily. She will continue to follow Heart Healthy or DASH diet.  - amLODipine (NORVASC) 10 MG tablet; Take 1 tablet (10 mg total) by mouth daily.  Dispense: 30 tablet; Refill: 1  2. Breast cancer screening - MM Digital Diagnostic Bilat; Future  3. Healthcare maintenance - CBC with Differential - Comprehensive metabolic panel - TSH - Lipid Panel - Vitamin D, 25-hydroxy - Vitamin B12  4. Screening for diabetes mellitus Hgb A1c is normal at 5.4 today. She will continue to decrease foods/beverages high in sugars and carbs and follow Heart Healthy or DASH diet. Increase physical activity to at least 30 minutes cardio exercise daily.  She will continue to decrease foods/beverages high in sugars and carbs and follow Heart Healthy or DASH diet. Increase physical activity to at least 30 minutes cardio exercise daily.   - POCT glycosylated hemoglobin (Hb A1C) - POCT urinalysis dipstick  5. Follow up She will follow up in 2 weeks for blood pressure check.    Meds  ordered this encounter  Medications  . amLODipine (NORVASC) 10 MG tablet    Sig: Take 1 tablet (10 mg total) by mouth daily.    Dispense:  30 tablet    Refill:  1    Referral Orders  No referral(s) requested today   Raliegh Ip,  MSN, FNP-C Patient  [facility] [address] [place], [place] [address] [phone]

## 2018-09-03 LAB — CBC WITH DIFFERENTIAL/PLATELET
Basophils Absolute: 0.1 10*3/uL (ref 0.0–0.2)
Basos: 1 %
EOS (ABSOLUTE): 0.1 10*3/uL (ref 0.0–0.4)
Eos: 1 %
Hematocrit: 38.7 % (ref 34.0–46.6)
Hemoglobin: 13.1 g/dL (ref 11.1–15.9)
Immature Grans (Abs): 0 10*3/uL (ref 0.0–0.1)
Immature Granulocytes: 1 %
Lymphocytes Absolute: 1.8 10*3/uL (ref 0.7–3.1)
Lymphs: 27 %
MCH: 31.3 pg (ref 26.6–33.0)
MCHC: 33.9 g/dL (ref 31.5–35.7)
MCV: 93 fL (ref 79–97)
Monocytes Absolute: 0.6 10*3/uL (ref 0.1–0.9)
Monocytes: 8 %
Neutrophils Absolute: 4.1 10*3/uL (ref 1.4–7.0)
Neutrophils: 62 %
Platelets: 397 10*3/uL (ref 150–450)
RBC: 4.18 x10E6/uL (ref 3.77–5.28)
RDW: 12.2 % — ABNORMAL LOW (ref 12.3–15.4)
WBC: 6.6 10*3/uL (ref 3.4–10.8)

## 2018-09-03 LAB — COMPREHENSIVE METABOLIC PANEL
ALT: 27 IU/L (ref 0–32)
AST: 24 IU/L (ref 0–40)
Albumin/Globulin Ratio: 2.1 (ref 1.2–2.2)
Albumin: 4.8 g/dL (ref 3.5–5.5)
Alkaline Phosphatase: 97 IU/L (ref 39–117)
BUN/Creatinine Ratio: 22 (ref 9–23)
BUN: 15 mg/dL (ref 6–24)
Bilirubin Total: 0.2 mg/dL (ref 0.0–1.2)
CO2: 23 mmol/L (ref 20–29)
Calcium: 9.6 mg/dL (ref 8.7–10.2)
Chloride: 104 mmol/L (ref 96–106)
Creatinine, Ser: 0.67 mg/dL (ref 0.57–1.00)
GFR calc Af Amer: 113 mL/min/{1.73_m2} (ref >59)
GFR calc non Af Amer: 98 mL/min/{1.73_m2} (ref >59)
Globulin, Total: 2.3 g/dL (ref 1.5–4.5)
Glucose: 84 mg/dL (ref 65–99)
Potassium: 4.8 mmol/L (ref 3.5–5.2)
Sodium: 142 mmol/L (ref 134–144)
Total Protein: 7.1 g/dL (ref 6.0–8.5)

## 2018-09-03 LAB — LIPID PANEL
Chol/HDL Ratio: 2.5 ratio (ref 0.0–4.4)
Cholesterol, Total: 188 mg/dL (ref 100–199)
HDL: 75 mg/dL (ref >39)
LDL Calculated: 86 mg/dL (ref 0–99)
Triglycerides: 134 mg/dL (ref 0–149)
VLDL Cholesterol Cal: 27 mg/dL (ref 5–40)

## 2018-09-03 LAB — VITAMIN D 25 HYDROXY (VIT D DEFICIENCY, FRACTURES): Vit D, 25-Hydroxy: 28.8 ng/mL — ABNORMAL LOW (ref 30.0–100.0)

## 2018-09-03 LAB — VITAMIN B12: Vitamin B-12: 577 pg/mL (ref 232–1245)

## 2018-09-03 LAB — TSH: TSH: 1.29 u[IU]/mL (ref 0.450–4.500)

## 2018-09-12 ENCOUNTER — Ambulatory Visit: Payer: Self-pay | Admitting: Family Medicine

## 2018-09-24 ENCOUNTER — Ambulatory Visit (INDEPENDENT_AMBULATORY_CARE_PROVIDER_SITE_OTHER): Payer: Self-pay | Admitting: Family Medicine

## 2018-09-24 ENCOUNTER — Encounter: Payer: Self-pay | Admitting: Family Medicine

## 2018-09-24 VITALS — BP 146/88 | HR 88 | Temp 97.5°F | Ht 62.0 in | Wt 149.0 lb

## 2018-09-24 DIAGNOSIS — Z09 Encounter for follow-up examination after completed treatment for conditions other than malignant neoplasm: Secondary | ICD-10-CM

## 2018-09-24 DIAGNOSIS — I10 Essential (primary) hypertension: Secondary | ICD-10-CM

## 2018-09-24 DIAGNOSIS — Z0000 Encounter for general adult medical examination without abnormal findings: Secondary | ICD-10-CM

## 2018-09-24 DIAGNOSIS — E785 Hyperlipidemia, unspecified: Secondary | ICD-10-CM

## 2018-09-24 LAB — POCT URINALYSIS DIP (MANUAL ENTRY)
Bilirubin, UA: NEGATIVE
Blood, UA: NEGATIVE
Glucose, UA: NEGATIVE mg/dL
Ketones, POC UA: NEGATIVE mg/dL
Leukocytes, UA: NEGATIVE
Nitrite, UA: NEGATIVE
Protein Ur, POC: NEGATIVE mg/dL
Spec Grav, UA: 1.015 (ref 1.010–1.025)
Urobilinogen, UA: 0.2 E.U./dL
pH, UA: 7 (ref 5.0–8.0)

## 2018-09-24 MED ORDER — ATORVASTATIN CALCIUM 40 MG PO TABS: 40.0000 mg | ORAL_TABLET | Freq: Every day | ORAL | 2 refills | Status: DC

## 2018-09-24 MED ORDER — FISH OIL 1000 MG PO CAPS: 3000.0000 mg | ORAL_CAPSULE | Freq: Every day | ORAL | 2 refills | Status: DC

## 2018-09-24 MED ORDER — AMLODIPINE BESYLATE 10 MG PO TABS: 10.0000 mg | ORAL_TABLET | Freq: Every day | ORAL | 2 refills | Status: DC

## 2018-09-24 MED ORDER — IPRATROPIUM BROMIDE 0.03 % NA SOLN: 2.0000 | Freq: Two times a day (BID) | NASAL | 11 refills | Status: DC

## 2018-09-24 NOTE — Progress Notes (Signed)
Follow Up  Subjective:    Patient ID: Sharon Christensen, female    DOB: Dec 08, 1960, 57 y.o.   MRN: [id number]  Chief Complaint  Patient presents with  . Follow-up    HTN   HPI   Sharon Christensen is a 57 year old female with a past medical history of Hypertension and Hyperlipidemia. She is here today for follow up assessment on chronic diseases.    Current Status: Since her last office visit, she is doing well with no complaints. She denies visual changes, chest pain, cough, shortness of breath, heart palpitations, and falls. She has occasionally headaches and dizziness with position changes. Denies severe headaches, confusion, seizures, double vision, and blurred vision, nausea and vomiting.  She denies fevers, chills, recent infections, weight loss, and night sweats.  No reports of GI problems such as nausea, vomiting, diarrhea, and constipation. She has no reports of blood in stools, dysuria and hematuria. No depression or anxiety reported. She denies pain today.   Past Medical History:  Diagnosis Date  . Hyperlipidemia   . Hypertension     Family History  Problem Relation Age of Onset  . Diabetes Mother   . Cancer Father   . Diabetes Maternal Aunt     Social History   Socioeconomic History  . Marital status: Single    Spouse name: Not on file  . Number of children: Not on file  . Years of education: Not on file  . Highest education level: Not on file  Occupational History  . Not on file  Social Needs  . Financial resource strain: Not on file  . Food insecurity:    Worry: Not on file    Inability: Not on file  . Transportation needs:    Medical: Not on file    Non-medical: Not on file  Tobacco Use  . Smoking status: Current Every Day Smoker    Packs/day: 0.50    Types: Cigarettes  . Smokeless tobacco: Never Used  Substance and Sexual Activity  . Alcohol use: Yes    Alcohol/week: 2.0 standard drinks    Types: 2 Cans of beer per week  . Drug use: No  . Sexual  activity: Not on file  Lifestyle  . Physical activity:    Days per week: Not on file    Minutes per session: Not on file  . Stress: Not on file  Relationships  . Social connections:    Talks on phone: Not on file    Gets together: Not on file    Attends religious service: Not on file    Active member of club or organization: Not on file    Attends meetings of clubs or organizations: Not on file    Relationship status: Not on file  . Intimate partner violence:    Fear of current or ex partner: Not on file    Emotionally abused: Not on file    Physically abused: Not on file    Forced sexual activity: Not on file  Other Topics Concern  . Not on file  Social History Narrative  . Not on file    No past surgical history on file.     There is no immunization history on file for this patient.   Review of Systems  Constitutional: Negative.   HENT: Negative.   Eyes: Negative.   Respiratory: Negative.   Cardiovascular: Negative.   Gastrointestinal: Negative.   Genitourinary: Negative.   Musculoskeletal: Negative.   Skin: Negative.   Neurological: Negative.  Hematological: Negative.   Psychiatric/Behavioral: Negative.        Objective:   Physical Exam  Constitutional: She is oriented to person, place, and time. She appears well-developed and well-nourished.  Cardiovascular: Normal rate, regular rhythm, normal heart sounds and intact distal pulses.  Pulmonary/Chest: Effort normal and breath sounds normal.  Abdominal: Soft. Bowel sounds are normal.  Neurological: She is alert and oriented to person, place, and time.  Psychiatric: She has a normal mood and affect. Her behavior is normal. Judgment and thought content normal.  Nursing note and vitals reviewed.   Assessment & Plan:   1. Essential hypertension Blood pressure is 146/88 today. Continue antihypertensive medications as prescribed. She will continue to decrease high sodium intake, excessive alcohol intake,  increase potassium intake, smoking cessation, and increase physical activity of at least 30 minutes of cardio activity daily. She will continue to follow Heart Healthy or DASH diet. - POCT urinalysis dipstick - amLODipine (NORVASC) 10 MG tablet; Take 1 tablet (10 mg total) by mouth daily.  Dispense: 90 tablet; Refill: 2  2. Hyperlipidemia, unspecified hyperlipidemia type - atorvastatin (LIPITOR) 40 MG tablet; Take 1 tablet (40 mg total) by mouth daily.  Dispense: 90 tablet; Refill: 2  3. Healthcare maintenance - ipratropium (ATROVENT) 0.03 % nasal spray; Place 2 sprays into both nostrils 2 (two) times daily.  Dispense: 30 mL; Refill: 11 - Omega-3 Fatty Acids (FISH OIL) 1000 MG CAPS; Take 3 capsules (3,000 mg total) by mouth daily.  Dispense: 90 capsule; Refill: 2  4. Follow up She will follow up in 6 months.   Meds ordered this encounter  Medications  . amLODipine (NORVASC) 10 MG tablet    Sig: Take 1 tablet (10 mg total) by mouth daily.    Dispense:  90 tablet    Refill:  2  . atorvastatin (LIPITOR) 40 MG tablet    Sig: Take 1 tablet (40 mg total) by mouth daily.    Dispense:  90 tablet    Refill:  2  . ipratropium (ATROVENT) 0.03 % nasal spray    Sig: Place 2 sprays into both nostrils 2 (two) times daily.    Dispense:  30 mL    Refill:  11  . Omega-3 Fatty Acids (FISH OIL) 1000 MG CAPS    Sig: Take 3 capsules (3,000 mg total) by mouth daily.    Dispense:  90 capsule    Refill:  2   Raliegh Ip,  MSN, FNP-C Patient [facility] [address] [place], [place] [address] [phone]

## 2018-09-30 ENCOUNTER — Encounter: Payer: Self-pay | Admitting: Family Medicine

## 2018-09-30 ENCOUNTER — Ambulatory Visit (INDEPENDENT_AMBULATORY_CARE_PROVIDER_SITE_OTHER): Payer: Self-pay | Admitting: Family Medicine

## 2018-09-30 VITALS — BP 146/78 | HR 88 | Temp 97.6°F | Ht 62.0 in | Wt 148.2 lb

## 2018-09-30 DIAGNOSIS — I10 Essential (primary) hypertension: Secondary | ICD-10-CM

## 2018-09-30 DIAGNOSIS — R0602 Shortness of breath: Secondary | ICD-10-CM

## 2018-09-30 DIAGNOSIS — R05 Cough: Secondary | ICD-10-CM

## 2018-09-30 DIAGNOSIS — R059 Cough, unspecified: Secondary | ICD-10-CM

## 2018-09-30 DIAGNOSIS — E785 Hyperlipidemia, unspecified: Secondary | ICD-10-CM

## 2018-09-30 DIAGNOSIS — Z09 Encounter for follow-up examination after completed treatment for conditions other than malignant neoplasm: Secondary | ICD-10-CM

## 2018-09-30 MED ORDER — BENZONATATE 100 MG PO CAPS: 100.0000 mg | ORAL_CAPSULE | Freq: Two times a day (BID) | ORAL | 0 refills | Status: DC | PRN

## 2018-09-30 MED ORDER — ALBUTEROL SULFATE HFA 108 (90 BASE) MCG/ACT IN AERS: 2.0000 | INHALATION_SPRAY | Freq: Four times a day (QID) | RESPIRATORY_TRACT | 11 refills | Status: DC | PRN

## 2018-09-30 NOTE — Progress Notes (Signed)
Follow Up  Subjective:    Patient ID: Sharon BookbinderJanice Christensen, female    DOB: 03/25/1961, 57 y.o.   MRN: [id number]   Chief Complaint  Patient presents with  . Cough  . Bronchitis  . Nasal Congestion    HPI  [name] is a 57 year old female with a past medical history of Hypertension and Hyperlipidemia. She is her today for a sick visit.    Current Status: Since her last office visit, she continues to experience shortness of breath and cough. She states that she experiences shortness of breath intermittently and at bedtime.  She denies fevers, chills, fatigue, recent infections, weight loss, and night sweats. She has not had any headaches, visual changes, dizziness, and falls. No chest pain, and heart palpitations. No reports of GI problems such as nausea, vomiting, diarrhea, and constipation. She has no reports of blood in stools, dysuria and hematuria. No depression or anxiety reported. She denies pain today.   Review of Systems  Constitutional: Negative.   HENT: Negative.   Eyes: Negative.   Respiratory: Positive for cough and shortness of breath.   Cardiovascular: Negative.   Gastrointestinal: Negative.   Endocrine: Negative.   Genitourinary: Negative.   Musculoskeletal: Negative.   Skin: Negative.   Allergic/Immunologic: Negative.   Neurological: Negative.   Hematological: Negative.   Psychiatric/Behavioral: Negative.    Objective:   Physical Exam  Constitutional: She is oriented to person, place, and time. She appears well-developed and well-nourished.  HENT:  Head: Normocephalic and atraumatic.  Eyes: Pupils are equal, round, and reactive to light. Conjunctivae and EOM are normal.  Neck: Normal range of motion. Neck supple.  Cardiovascular: Normal rate, regular rhythm, normal heart sounds and intact distal pulses.  Pulmonary/Chest: Effort normal and breath sounds normal.  Abdominal: Soft. Bowel sounds are normal.  Musculoskeletal: Normal range of motion.  Neurological: She  is alert and oriented to person, place, and time.  Skin: Skin is warm and dry.  Psychiatric: She has a normal mood and affect. Her behavior is normal. Judgment and thought content normal.  Nursing note and vitals reviewed.  Assessment & Plan:   1. Cough Worsened. We will initiate Albuterol inhaler and Tessalon Perles today.  - albuterol (PROVENTIL HFA;VENTOLIN HFA) 108 (90 Base) MCG/ACT inhaler; Inhale 2 puffs into the lungs every 6 (six) hours as needed for wheezing or shortness of breath.  Dispense: 1 Inhaler; Refill: 11 - benzonatate (TESSALON) 100 MG capsule; Take 1 capsule (100 mg total) by mouth 2 (two) times daily as needed for cough.  Dispense: 20 capsule; Refill: 0  2. Shortness of breath - albuterol (PROVENTIL HFA;VENTOLIN HFA) 108 (90 Base) MCG/ACT inhaler; Inhale 2 puffs into the lungs every 6 (six) hours as needed for wheezing or shortness of breath.  Dispense: 1 Inhaler; Refill: 11 - benzonatate (TESSALON) 100 MG capsule; Take 1 capsule (100 mg total) by mouth 2 (two) times daily as needed for cough.  Dispense: 20 capsule; Refill: 0  3. Essential hypertension Antihypertensive medication is effective. Blood pressure is at 146/78 today. Continue Amlodipine as prescribed. She will continue to decrease high sodium intake, excessive alcohol intake, increase potassium intake, smoking cessation, and increase physical activity of at least 30 minutes of cardio activity daily. She will continue to follow Heart Healthy or DASH diet.  4. Hyperlipidemia, unspecified hyperlipidemia type Continue Atorvastatin as prescribed.   5. Follow up She will keep previously scheduled appointment.   Meds ordered this encounter  Medications  . albuterol (PROVENTIL HFA;VENTOLIN  HFA) 108 (90 Base) MCG/ACT inhaler    Sig: Inhale 2 puffs into the lungs every 6 (six) hours as needed for wheezing or shortness of breath.    Dispense:  1 Inhaler    Refill:  11  . benzonatate (TESSALON) 100 MG capsule     Sig: Take 1 capsule (100 mg total) by mouth 2 (two) times daily as needed for cough.    Dispense:  20 capsule    Refill:  0   Raliegh Ip,  MSN, FNP-C Patient [facility] [address] [place], [place] [address] [phone]

## 2018-09-30 NOTE — Patient Instructions (Signed)
Albuterol inhalation aerosol What is this medicine? ALBUTEROL (al Normajean [place]) is a bronchodilator. It helps open up the airways in your lungs to make it easier to breathe. This medicine is used to treat and to prevent bronchospasm. This medicine may be used for other purposes; ask your health care provider or pharmacist if you have questions. COMMON BRAND NAME(S): Proair HFA, Proventil, Proventil HFA, Respirol, Ventolin, Ventolin HFA What should I tell my health care provider before I take this medicine? They need to know if you have any of the following conditions: -diabetes -heart disease or irregular heartbeat -high blood pressure -pheochromocytoma -seizures -thyroid disease -an unusual or allergic reaction to albuterol, levalbuterol, sulfites, other medicines, foods, dyes, or preservatives -pregnant or trying to get pregnant -breast-feeding How should I use this medicine? This medicine is for inhalation through the mouth. Follow the directions on your prescription label. Take your medicine at regular intervals. Do not use more often than directed. Make sure that you are using your inhaler correctly. Ask you doctor or health care provider if you have any questions. Talk to your pediatrician regarding the use of this medicine in children. Special care may be needed. Overdosage: If you think you have taken too much of this medicine contact a poison control center or emergency room at once. NOTE: This medicine is only for you. Do not share this medicine with others. What if I miss a dose? If you miss a dose, use it as soon as you can. If it is almost time for your next dose, use only that dose. Do not use double or extra doses. What may interact with this medicine? -anti-infectives like chloroquine and pentamidine -caffeine -cisapride -diuretics -medicines for colds -medicines for depression or for emotional or psychotic conditions -medicines for weight loss including some herbal  products -methadone -some antibiotics like clarithromycin, erythromycin, levofloxacin, and linezolid -some heart medicines -steroid hormones like dexamethasone, cortisone, hydrocortisone -theophylline -thyroid hormones This list may not describe all possible interactions. Give your health care provider a list of all the medicines, herbs, non-prescription drugs, or dietary supplements you use. Also tell them if you smoke, drink alcohol, or use illegal drugs. Some items may interact with your medicine. What should I watch for while using this medicine? Tell your doctor or health care professional if your symptoms do not improve. Do not use extra albuterol. If your asthma or bronchitis gets worse while you are using this medicine, call your doctor right away. If your mouth gets dry try chewing sugarless gum or sucking hard candy. Drink water as directed. What side effects may I notice from receiving this medicine? Side effects that you should report to your doctor or health care professional as soon as possible: -allergic reactions like skin rash, itching or hives, swelling of the face, lips, or tongue -breathing problems -chest pain -feeling faint or lightheaded, falls -high blood pressure -irregular heartbeat -fever -muscle cramps or weakness -pain, tingling, numbness in the hands or feet -vomiting Side effects that usually do not require medical attention (report to your doctor or health care professional if they continue or are bothersome): -cough -difficulty sleeping -headache -nervousness or trembling -stomach upset -stuffy or runny nose -throat irritation -unusual taste This list may not describe all possible side effects. Call your doctor for medical advice about side effects. You may report side effects to FDA at 1-800-FDA-1088. Where should I keep my medicine? Keep out of the reach of children. Store at room temperature between 15 and 30 degrees  C (59 and 86 degrees F). The  contents are under pressure and may burst when exposed to heat or flame. Do not freeze. This medicine does not work as well if it is too cold. Throw away any unused medicine after the expiration date. Inhalers need to be thrown away after the labeled number of puffs have been used or by the expiration date; whichever comes first. Ventolin HFA should be thrown away 12 months after removing from foil pouch. Check the instructions that come with your medicine. NOTE: This sheet is a summary. It may not cover all possible information. If you have questions about this medicine, talk to your doctor, pharmacist, or health care provider.  2018 Elsevier/Gold Standard (2013-04-16 10:57:17)        Benzonatate capsules What is this medicine? BENZONATATE (ben ZOE na tate) is used to treat cough. This medicine may be used for other purposes; ask your health care provider or pharmacist if you have questions. COMMON BRAND NAME(S): Tessalon Perles, Zonatuss What should I tell my health care provider before I take this medicine? They need to know if you have any of these conditions: -kidney or liver disease -an unusual or allergic reaction to benzonatate, anesthetics, other medicines, foods, dyes, or preservatives -pregnant or trying to get pregnant -breast-feeding How should I use this medicine? Take this medicine by mouth with a glass of water. Follow the directions on the prescription label. Avoid breaking, chewing, or sucking the capsule, as this can cause serious side effects. Take your medicine at regular intervals. Do not take your medicine more often than directed. Talk to your pediatrician regarding the use of this medicine in children. While this drug may be prescribed for children as young as 57 years old for selected conditions, precautions do apply. Overdosage: If you think you have taken too much of this medicine contact a poison control center or emergency room at once. NOTE: This medicine is  only for you. Do not share this medicine with others. What if I miss a dose? If you miss a dose, take it as soon as you can. If it is almost time for your next dose, take only that dose. Do not take double or extra doses. What may interact with this medicine? Do not take this medicine with any of the following medications: -MAOIs like Carbex, Eldepryl, Marplan, Nardil, and Parnate This list may not describe all possible interactions. Give your health care provider a list of all the medicines, herbs, non-prescription drugs, or dietary supplements you use. Also tell them if you smoke, drink alcohol, or use illegal drugs. Some items may interact with your medicine. What should I watch for while using this medicine? Tell your doctor if your symptoms do not improve or if they get worse. If you have a high fever, skin rash, or headache, see your health care professional. You may get drowsy or dizzy. Do not drive, use machinery, or do anything that needs mental alertness until you know how this medicine affects you. Do not sit or stand up quickly, especially if you are an older patient. This reduces the risk of dizzy or fainting spells. What side effects may I notice from receiving this medicine? Side effects that you should report to your doctor or health care professional as soon as possible: -allergic reactions like skin rash, itching or hives, swelling of the face, lips, or tongue -breathing problems -chest pain -confusion or hallucinations -irregular heartbeat -numbness of mouth or throat -seizures Side effects that usually do  not require medical attention (report to your doctor or health care professional if they continue or are bothersome): -burning feeling in the eyes -constipation -headache -nasal congestion -stomach upset This list may not describe all possible side effects. Call your doctor for medical advice about side effects. You may report side effects to FDA at  1-800-FDA-1088. Where should I keep my medicine? Keep out of the reach of children. Store at room temperature between 15 and 30 degrees C (59 and 86 degrees F). Keep tightly closed. Protect from light and moisture. Throw away any unused medicine after the expiration date. NOTE: This sheet is a summary. It may not cover all possible information. If you have questions about this medicine, talk to your doctor, pharmacist, or health care provider.  2018 Elsevier/Gold Standard (2008-01-28 14:52:56)

## 2019-02-16 ENCOUNTER — Ambulatory Visit: Payer: Self-pay | Admitting: Family Medicine

## 2019-03-25 ENCOUNTER — Encounter: Payer: Self-pay | Admitting: Family Medicine

## 2019-03-25 ENCOUNTER — Ambulatory Visit (INDEPENDENT_AMBULATORY_CARE_PROVIDER_SITE_OTHER): Payer: BLUE CROSS/BLUE SHIELD | Admitting: Family Medicine

## 2019-03-25 ENCOUNTER — Other Ambulatory Visit: Payer: Self-pay

## 2019-03-25 VITALS — BP 150/82 | HR 84 | Temp 97.6°F | Ht 62.0 in | Wt 153.0 lb

## 2019-03-25 DIAGNOSIS — J302 Other seasonal allergic rhinitis: Secondary | ICD-10-CM | POA: Diagnosis not present

## 2019-03-25 DIAGNOSIS — R05 Cough: Secondary | ICD-10-CM

## 2019-03-25 DIAGNOSIS — Z09 Encounter for follow-up examination after completed treatment for conditions other than malignant neoplasm: Secondary | ICD-10-CM

## 2019-03-25 DIAGNOSIS — R0602 Shortness of breath: Secondary | ICD-10-CM

## 2019-03-25 DIAGNOSIS — I10 Essential (primary) hypertension: Secondary | ICD-10-CM

## 2019-03-25 DIAGNOSIS — R059 Cough, unspecified: Secondary | ICD-10-CM

## 2019-03-25 LAB — POCT URINALYSIS DIP (MANUAL ENTRY)
Bilirubin, UA: NEGATIVE
Blood, UA: NEGATIVE
Glucose, UA: NEGATIVE mg/dL
Ketones, POC UA: NEGATIVE mg/dL
Leukocytes, UA: NEGATIVE
Nitrite, UA: NEGATIVE
Protein Ur, POC: NEGATIVE mg/dL
Spec Grav, UA: 1.025 (ref 1.010–1.025)
Urobilinogen, UA: 0.2 E.U./dL
pH, UA: 5.5 (ref 5.0–8.0)

## 2019-03-25 MED ORDER — CETIRIZINE HCL 10 MG PO TABS
10.0000 mg | ORAL_TABLET | Freq: Every day | ORAL | 11 refills | Status: DC
Start: 2019-03-25 — End: 2020-02-24

## 2019-03-25 MED ORDER — ALBUTEROL SULFATE HFA 108 (90 BASE) MCG/ACT IN AERS: 2.0000 | INHALATION_SPRAY | Freq: Four times a day (QID) | RESPIRATORY_TRACT | 11 refills | Status: DC | PRN

## 2019-03-25 NOTE — Progress Notes (Signed)
[facility]   Established Patient Office Visit  Subjective:  Patient ID: Sharon Christensen, female    DOB: February 26, 1961  Age: 58 y.o. MRN: [id number]  CC:  Chief Complaint  Patient presents with  . Follow-up    chronic condition     HPI  Sharon Christensen is a 58 year old female who presents for Follow Up today.   Past Medical History:  Diagnosis Date  . Hyperlipidemia   . Hypertension    Current Status: Since her last office visit, she is doing well with no complaints. She denies visual changes, chest pain, cough, shortness of breath, heart palpitations, and falls. She has occasional headaches and dizziness with position changes. Denies severe headaches, confusion, seizures, double vision, and blurred vision, nausea and vomiting.   She denies fevers, chills, fatigue, recent infections, weight loss, and night sweats. No reports of GI problems such as diarrhea, and constipation. She has no reports of blood in stools, dysuria and hematuria. No depression or anxiety reported. She denies pain today.    History reviewed. No pertinent surgical history.  Family History  Problem Relation Age of Onset  . Diabetes Mother   . Cancer Father   . Diabetes Maternal Aunt     Social History   Socioeconomic History  . Marital status: Single    Spouse name: Not on file  . Number of children: Not on file  . Years of education: Not on file  . Highest education level: Not on file  Occupational History  . Not on file  Social Needs  . Financial resource strain: Not on file  . Food insecurity:    Worry: Not on file    Inability: Not on file  . Transportation needs:    Medical: Not on file    Non-medical: Not on file  Tobacco Use  . Smoking status: Current Every Day Smoker    Packs/day: 0.50    Types: Cigarettes  . Smokeless tobacco: Never Used  Substance and Sexual Activity  . Alcohol use: Yes    Alcohol/week: 2.0 standard drinks    Types:  2 Cans of beer per week  . Drug use: No  . Sexual activity: Not on file  Lifestyle  . Physical activity:    Days per week: Not on file    Minutes per session: Not on file  . Stress: Not on file  Relationships  . Social connections:    Talks on phone: Not on file    Gets together: Not on file    Attends religious service: Not on file    Active member of club or organization: Not on file    Attends meetings of clubs or organizations: Not on file    Relationship status: Not on file  . Intimate partner violence:    Fear of current or ex partner: Not on file    Emotionally abused: Not on file    Physically abused: Not on file    Forced sexual activity: Not on file  Other Topics Concern  . Not on file  Social History Narrative  . Not on file    Outpatient Medications Prior to Visit  Medication Sig Dispense Refill  . amLODipine (NORVASC) 10 MG tablet Take 1 tablet (10 mg total) by mouth daily. 90 tablet 2  . atorvastatin (LIPITOR) 40 MG tablet Take 1 tablet (40 mg total) by mouth daily. 90 tablet 2  . ipratropium (ATROVENT) 0.03 % nasal spray Place 2  sprays into both nostrils 2 (two) times daily. 30 mL 11  . Omega-3 Fatty Acids (FISH OIL) 1000 MG CAPS Take 3 capsules (3,000 mg total) by mouth daily. 90 capsule 2  . albuterol (PROVENTIL HFA;VENTOLIN HFA) 108 (90 Base) MCG/ACT inhaler Inhale 2 puffs into the lungs every 6 (six) hours as needed for wheezing or shortness of breath. 1 Inhaler 11  . benzonatate (TESSALON) 100 MG capsule Take 1 capsule (100 mg total) by mouth 2 (two) times daily as needed for cough. (Patient not taking: Reported on 03/25/2019) 20 capsule 0  . pseudoephedrine (SUDAFED) 30 MG tablet Take 30 mg by mouth every 4 (four) hours as needed for congestion.     No facility-administered medications prior to visit.     No Known Allergies  ROS Review of Systems  Constitutional: Negative.   HENT: Negative.   Eyes: Negative.   Respiratory: Positive for cough  (occasional) and shortness of breath (Occasional ).   Cardiovascular: Negative.   Gastrointestinal: Negative.   Endocrine: Negative.   Genitourinary: Negative.   Musculoskeletal: Negative.   Skin: Negative.   Allergic/Immunologic: Negative.        Seasonal Allergies  Neurological: Positive for dizziness and headaches.  Hematological: Negative.   Psychiatric/Behavioral: Negative.       Objective:    Physical Exam  Constitutional: She is oriented to person, place, and time. She appears well-developed and well-nourished.  HENT:  Head: Normocephalic and atraumatic.  Eyes: Conjunctivae are normal.  Neck: Normal range of motion. Neck supple.  Cardiovascular: Normal rate, regular rhythm, normal heart sounds and intact distal pulses.  Pulmonary/Chest: Effort normal and breath sounds normal.  Abdominal: Soft. Bowel sounds are normal.  Musculoskeletal: Normal range of motion.  Neurological: She is alert and oriented to person, place, and time. She has normal reflexes.  Skin: Skin is warm and dry.  Psychiatric: She has a normal mood and affect. Her behavior is normal. Judgment and thought content normal.  Nursing note and vitals reviewed.   BP (!) 150/82   Pulse 84   Temp 97.6 F (36.4 C) (Oral)   Ht 5\' 2"  (1.575 m)   Wt 153 lb (69.4 kg)   SpO2 100%   BMI 27.98 kg/m  Wt Readings from Last 3 Encounters:  03/25/19 153 lb (69.4 kg)  09/30/18 148 lb 3.2 oz (67.2 kg)  09/24/18 149 lb (67.6 kg)     Health Maintenance Due  Topic Date Due  . HIV Screening  11/29/1975  . MAMMOGRAM  11/28/2010  . COLONOSCOPY  11/28/2010    There are no preventive care reminders to display for this patient.  Lab Results  Component Value Date   TSH 1.290 09/02/2018   Lab Results  Component Value Date   WBC 6.6 09/02/2018   HGB 13.1 09/02/2018   HCT 38.7 09/02/2018   MCV 93 09/02/2018   PLT 397 09/02/2018   Lab Results  Component Value Date   NA 142 09/02/2018   K 4.8 09/02/2018    CO2 23 09/02/2018   GLUCOSE 84 09/02/2018   BUN 15 09/02/2018   CREATININE 0.67 09/02/2018   BILITOT 0.2 09/02/2018   ALKPHOS 97 09/02/2018   AST 24 09/02/2018   ALT 27 09/02/2018   PROT 7.1 09/02/2018   ALBUMIN 4.8 09/02/2018   CALCIUM 9.6 09/02/2018   Lab Results  Component Value Date   CHOL 188 09/02/2018   Lab Results  Component Value Date   HDL 75 09/02/2018   Lab Results  Component Value Date   LDLCALC 86 09/02/2018   Lab Results  Component Value Date   TRIG 134 09/02/2018   Lab Results  Component Value Date   CHOLHDL 2.5 09/02/2018   Lab Results  Component Value Date   HGBA1C 5.4 09/02/2018      Assessment & Plan:   1. Essential hypertension The current medical regimen is effective; blood pressure is stable at 150/82 today; continue present plan and medications.  - POCT urinalysis dipstick  2. Cough Resolved.  - albuterol (VENTOLIN HFA) 108 (90 Base) MCG/ACT inhaler; Inhale 2 puffs into the lungs every 6 (six) hours as needed for wheezing or shortness of breath.  Dispense: 1 Inhaler; Refill: 11  3. Shortness of breath Stable today. Continue Inhaler as prescribed.  - albuterol (VENTOLIN HFA) 108 (90 Base) MCG/ACT inhaler; Inhale 2 puffs into the lungs every 6 (six) hours as needed for wheezing or shortness of breath.  Dispense: 1 Inhaler; Refill: 11  4. Seasonal allergies We will initiate Zyrtec today.  - cetirizine (ZYRTEC) 10 MG tablet; Take 1 tablet (10 mg total) by mouth daily.  Dispense: 30 tablet; Refill: 11  5. Follow up She will follow up in 6 months.   Meds ordered this encounter  Medications  . albuterol (VENTOLIN HFA) 108 (90 Base) MCG/ACT inhaler    Sig: Inhale 2 puffs into the lungs every 6 (six) hours as needed for wheezing or shortness of breath.    Dispense:  1 Inhaler    Refill:  11  . cetirizine (ZYRTEC) 10 MG tablet    Sig: Take 1 tablet (10 mg total) by mouth daily.    Dispense:  30 tablet    Refill:  11    Orders Placed  This Encounter  Procedures  . POCT urinalysis dipstick   Referral Orders  No referral(s) requested today    Raliegh Ip,  MSN, FNP-C [facility] [address] [place], [place] [address] [phone]   Problem List Items Addressed This Visit    None    Visit Diagnoses    Essential hypertension    -  Primary   Relevant Orders   POCT urinalysis dipstick (Completed)   Cough       Relevant Medications   albuterol (VENTOLIN HFA) 108 (90 Base) MCG/ACT inhaler   Shortness of breath       Relevant Medications   albuterol (VENTOLIN HFA) 108 (90 Base) MCG/ACT inhaler   Seasonal allergies       Relevant Medications   cetirizine (ZYRTEC) 10 MG tablet   Follow up          Meds ordered this encounter  Medications  . albuterol (VENTOLIN HFA) 108 (90 Base) MCG/ACT inhaler    Sig: Inhale 2 puffs into the lungs every 6 (six) hours as needed for wheezing or shortness of breath.    Dispense:  1 Inhaler    Refill:  11  . cetirizine (ZYRTEC) 10 MG tablet    Sig: Take 1 tablet (10 mg total) by mouth daily.    Dispense:  30 tablet    Refill:  11    Follow-up: Return in about 6 months (around 09/25/2019).    Kallie Locks, FNP

## 2019-03-26 DIAGNOSIS — J302 Other seasonal allergic rhinitis: Secondary | ICD-10-CM | POA: Insufficient documentation

## 2019-03-26 DIAGNOSIS — I10 Essential (primary) hypertension: Secondary | ICD-10-CM | POA: Insufficient documentation

## 2019-03-26 DIAGNOSIS — R0602 Shortness of breath: Secondary | ICD-10-CM | POA: Insufficient documentation

## 2019-03-27 ENCOUNTER — Other Ambulatory Visit: Payer: Self-pay

## 2019-03-27 DIAGNOSIS — E785 Hyperlipidemia, unspecified: Secondary | ICD-10-CM

## 2019-03-27 DIAGNOSIS — I10 Essential (primary) hypertension: Secondary | ICD-10-CM

## 2019-03-27 MED ORDER — AMLODIPINE BESYLATE 10 MG PO TABS: 10.0000 mg | ORAL_TABLET | Freq: Every day | ORAL | 2 refills | Status: DC

## 2019-03-27 MED ORDER — ATORVASTATIN CALCIUM 40 MG PO TABS: 40.0000 mg | ORAL_TABLET | Freq: Every day | ORAL | 2 refills | Status: DC

## 2019-03-27 NOTE — Telephone Encounter (Signed)
Medication sent to pharmacy  

## 2019-05-18 ENCOUNTER — Telehealth: Payer: Self-pay

## 2019-05-18 NOTE — Telephone Encounter (Signed)
Patient states that she has poison ivy on her arms and needs a antibiotic sent to the [facility] in [place].

## 2019-05-19 ENCOUNTER — Other Ambulatory Visit: Payer: Self-pay | Admitting: Family Medicine

## 2019-05-19 ENCOUNTER — Other Ambulatory Visit: Payer: Self-pay

## 2019-05-19 DIAGNOSIS — L237 Allergic contact dermatitis due to plants, except food: Secondary | ICD-10-CM

## 2019-05-19 MED ORDER — DIPHENHYDRAMINE HCL 25 MG PO CAPS: 25.0000 mg | ORAL_CAPSULE | Freq: Four times a day (QID) | ORAL | 1 refills | Status: DC | PRN

## 2019-05-19 MED ORDER — PREDNISONE 20 MG PO TABS: ORAL_TABLET | ORAL | 0 refills | Status: DC

## 2019-05-19 MED FILL — predniSONE 20 MG TABS: 20 | 15 days supply | Qty: 24 | Fill #0

## 2019-05-19 NOTE — Telephone Encounter (Signed)
Left a detail vm  

## 2019-05-19 NOTE — Telephone Encounter (Signed)
Per provider okay to send medication to [facility]

## 2019-08-18 ENCOUNTER — Other Ambulatory Visit: Payer: Self-pay | Admitting: Family Medicine

## 2019-08-18 ENCOUNTER — Telehealth: Payer: Self-pay | Admitting: Family Medicine

## 2019-08-18 ENCOUNTER — Other Ambulatory Visit: Payer: Self-pay

## 2019-08-18 DIAGNOSIS — R05 Cough: Secondary | ICD-10-CM

## 2019-08-18 DIAGNOSIS — R059 Cough, unspecified: Secondary | ICD-10-CM

## 2019-08-18 DIAGNOSIS — Z20822 Contact with and (suspected) exposure to covid-19: Secondary | ICD-10-CM

## 2019-08-18 MED ORDER — BENZONATATE 100 MG PO CAPS: 100.0000 mg | ORAL_CAPSULE | Freq: Two times a day (BID) | ORAL | 0 refills | Status: DC | PRN

## 2019-08-18 NOTE — Telephone Encounter (Signed)
Sent to wrong NP

## 2019-08-18 NOTE — Telephone Encounter (Signed)
Sent to [facility] in [place]  

## 2019-08-19 NOTE — Telephone Encounter (Signed)
Patient aware of RX

## 2019-08-20 ENCOUNTER — Telehealth: Payer: Self-pay

## 2019-08-20 ENCOUNTER — Other Ambulatory Visit: Payer: Self-pay | Admitting: Family Medicine

## 2019-08-20 DIAGNOSIS — R0989 Other specified symptoms and signs involving the circulatory and respiratory systems: Secondary | ICD-10-CM

## 2019-08-20 LAB — NOVEL CORONAVIRUS, NAA: SARS-CoV-2, NAA: NOT DETECTED

## 2019-08-20 MED ORDER — AZITHROMYCIN 250 MG PO TABS: ORAL_TABLET | ORAL | 0 refills | Status: DC

## 2019-08-20 NOTE — Telephone Encounter (Signed)
Message left on patient voicemail.

## 2019-08-20 NOTE — Telephone Encounter (Signed)
Patient was checked for COVID on 08/18/2019. Still c/o cough (white mucus), Nasal congestion (white mucus). Chest feels heavy. She was told she may have the Flu.  Patient requesting a Z pack ([facility] [place]). No other complaints.   CB# [phone]

## 2019-09-25 ENCOUNTER — Ambulatory Visit: Payer: BLUE CROSS/BLUE SHIELD | Admitting: Family Medicine

## 2019-10-13 DIAGNOSIS — Z8701 Personal history of pneumonia (recurrent): Secondary | ICD-10-CM

## 2019-10-13 HISTORY — DX: Personal history of pneumonia (recurrent): Z87.01

## 2019-10-27 ENCOUNTER — Telehealth: Payer: Self-pay | Admitting: Family Medicine

## 2019-10-27 NOTE — Telephone Encounter (Signed)
I am not this patient's provider.   Thanks

## 2019-10-29 ENCOUNTER — Other Ambulatory Visit: Payer: Self-pay | Admitting: Family Medicine

## 2019-10-29 DIAGNOSIS — R05 Cough: Secondary | ICD-10-CM

## 2019-10-29 DIAGNOSIS — R059 Cough, unspecified: Secondary | ICD-10-CM

## 2019-10-29 DIAGNOSIS — U071 COVID-19: Secondary | ICD-10-CM

## 2019-10-29 MED ORDER — AMOXICILLIN-POT CLAVULANATE 875-125 MG PO TABS: 1.0000 | ORAL_TABLET | Freq: Two times a day (BID) | ORAL | 0 refills | Status: AC

## 2019-10-29 MED ORDER — BENZONATATE 100 MG PO CAPS: 100.0000 mg | ORAL_CAPSULE | Freq: Two times a day (BID) | ORAL | 0 refills | Status: DC | PRN

## 2019-10-29 NOTE — Telephone Encounter (Signed)
Patient does not answer phone calls. Voicemail not active. Will continue to call.

## 2019-10-30 NOTE — Telephone Encounter (Signed)
Wrong Pt disregard all messages

## 2019-11-13 DIAGNOSIS — E559 Vitamin D deficiency, unspecified: Secondary | ICD-10-CM

## 2019-11-13 DIAGNOSIS — R600 Localized edema: Secondary | ICD-10-CM

## 2019-11-13 HISTORY — DX: Localized edema: R60.0

## 2019-11-13 HISTORY — DX: Vitamin D deficiency, unspecified: E55.9

## 2019-11-30 ENCOUNTER — Other Ambulatory Visit: Payer: Self-pay

## 2019-11-30 DIAGNOSIS — I10 Essential (primary) hypertension: Secondary | ICD-10-CM

## 2019-11-30 DIAGNOSIS — Z0000 Encounter for general adult medical examination without abnormal findings: Secondary | ICD-10-CM

## 2019-12-01 ENCOUNTER — Encounter: Payer: Self-pay | Admitting: Family Medicine

## 2019-12-01 ENCOUNTER — Ambulatory Visit (INDEPENDENT_AMBULATORY_CARE_PROVIDER_SITE_OTHER): Payer: 59 | Admitting: Family Medicine

## 2019-12-01 ENCOUNTER — Other Ambulatory Visit: Payer: Self-pay

## 2019-12-01 VITALS — BP 142/79 | HR 74 | Temp 97.7°F | Resp 16 | Ht 62.0 in | Wt 158.0 lb

## 2019-12-01 DIAGNOSIS — E785 Hyperlipidemia, unspecified: Secondary | ICD-10-CM

## 2019-12-01 DIAGNOSIS — I10 Essential (primary) hypertension: Secondary | ICD-10-CM

## 2019-12-01 DIAGNOSIS — R600 Localized edema: Secondary | ICD-10-CM | POA: Diagnosis not present

## 2019-12-01 DIAGNOSIS — Z0000 Encounter for general adult medical examination without abnormal findings: Secondary | ICD-10-CM

## 2019-12-01 DIAGNOSIS — Z09 Encounter for follow-up examination after completed treatment for conditions other than malignant neoplasm: Secondary | ICD-10-CM

## 2019-12-01 LAB — POCT URINALYSIS DIPSTICK
Bilirubin, UA: NEGATIVE
Blood, UA: NEGATIVE
Glucose, UA: NEGATIVE
Ketones, UA: NEGATIVE
Leukocytes, UA: NEGATIVE
Nitrite, UA: NEGATIVE
Protein, UA: NEGATIVE
Spec Grav, UA: 1.025 (ref 1.010–1.025)
Urobilinogen, UA: 0.2 E.U./dL
pH, UA: 5 (ref 5.0–8.0)

## 2019-12-01 MED ORDER — FUROSEMIDE 20 MG PO TABS: 20.0000 mg | ORAL_TABLET | Freq: Every day | ORAL | 3 refills | Status: DC

## 2019-12-01 NOTE — Progress Notes (Signed)
[name]    Established Patient Office Visit  Subjective:  Patient ID: Sharon Christensen, female    DOB: 1961/09/20  Age: 59 y.o. MRN: [id number]  CC:  Chief Complaint  Patient presents with  . Hypertension  . Foot Swelling    swelling in both feet/ankle   . Leg Problem    right leg numbness and tingling in thigh area only     HPI Sharon Christensen is a 59 year old female who presents for Follow Up today.   Past Medical History:  Diagnosis Date  . Bilateral lower extremity edema 11/2019  . History of mycoplasma pneumonia 10/2019  . Hyperlipidemia   . Hypertension    Current Status: Since her last office visit, she has c/o occasional bilateral lower extremity swelling in her ankles. She states that she first had this incident last Summer and only one additional time last week. She has not had any other incidents. She currently working 10 hour day on her feet and she continues to stand once she arrives home. She is doing well with no complaints. Recently had Coronavirus symptoms on 10/29/2019, which she tested negative, but positive for Pneumonia. She denies visual changes, chest pain, cough, and shortness of breath. She denies heart palpitations, and falls. She has occasional headaches and dizziness with position changes. Denies severe headaches, confusion, seizures, double vision, and blurred vision, nausea and vomiting. She denies fevers, chills, fatigue, recent infections, weight loss, and night sweats. No reports of GI problems such as diarrhea, and constipation. She has no reports of blood in stools, dysuria and hematuria. No depression or anxiety reported today. She denies suicidal ideations, homicidal ideations, or auditory hallucinations. She denies pain today.   History reviewed. No pertinent surgical history.  Family History  Problem Relation Age of Onset  . Diabetes Mother   . Cancer Father   . Diabetes Maternal Aunt      Social History   Socioeconomic History  . Marital status: Single    Spouse name: Not on file  . Number of children: Not on file  . Years of education: Not on file  . Highest education level: Not on file  Occupational History  . Not on file  Tobacco Use  . Smoking status: Current Every Day Smoker    Packs/day: 0.50    Types: Cigarettes  . Smokeless tobacco: Never Used  Substance and Sexual Activity  . Alcohol use: Yes    Alcohol/week: 2.0 standard drinks    Types: 2 Cans of beer per week  . Drug use: No  . Sexual activity: Not on file  Other Topics Concern  . Not on file  Social History Narrative  . Not on file   Social Determinants of Health   Financial Resource Strain:   . Difficulty of Paying Living Expenses: Not on file  Food Insecurity:   . Worried About Charity fundraiser in the Last Year: Not on file  . Ran Out of Food in the Last Year: Not on file  Transportation Needs:   . Lack of Transportation (Medical): Not on file  . Lack of Transportation (Non-Medical): Not on file  Physical Activity:   . Days of Exercise per Week: Not on file  . Minutes of Exercise per Session: Not on file  Stress:   . Feeling of Stress : Not on file  Social Connections:   . Frequency of Communication with Friends and Family: Not on file  .  Frequency of Social Gatherings with Friends and Family: Not on file  . Attends Religious Services: Not on file  . Active Member of Clubs or Organizations: Not on file  . Attends Banker Meetings: Not on file  . Marital Status: Not on file  Intimate Partner Violence:   . Fear of Current or Ex-Partner: Not on file  . Emotionally Abused: Not on file  . Physically Abused: Not on file  . Sexually Abused: Not on file    Outpatient Medications Prior to Visit  Medication Sig Dispense Refill  . albuterol (VENTOLIN HFA) 108 (90 Base) MCG/ACT inhaler Inhale 2 puffs into the lungs every 6 (six) hours as needed for wheezing or shortness  of breath. 1 Inhaler 11  . amLODipine (NORVASC) 10 MG tablet Take 1 tablet (10 mg total) by mouth daily. 90 tablet 2  . atorvastatin (LIPITOR) 40 MG tablet Take 1 tablet (40 mg total) by mouth daily. 90 tablet 2  . diphenhydrAMINE (BENADRYL ALLERGY) 25 mg capsule Take 1 capsule (25 mg total) by mouth every 6 (six) hours as needed. 30 capsule 1  . ipratropium (ATROVENT) 0.03 % nasal spray Place 2 sprays into both nostrils 2 (two) times daily. 30 mL 11  . Omega-3 Fatty Acids (FISH OIL) 1000 MG CAPS Take 3 capsules (3,000 mg total) by mouth daily. 90 capsule 2  . cetirizine (ZYRTEC) 10 MG tablet Take 1 tablet (10 mg total) by mouth daily. (Patient not taking: Reported on 12/01/2019) 30 tablet 11  . azithromycin (ZITHROMAX) 250 MG tablet Take 2 tablets by mouth on day #1, then take 1 tablet daily until complete as instructed. 6 tablet 0  . benzonatate (TESSALON) 100 MG capsule Take 1 capsule (100 mg total) by mouth 2 (two) times daily as needed for cough. 20 capsule 0  . predniSONE (DELTASONE) 20 MG tablet 3 tabs po daily x 3 days, then 2 tabs x 3 days, then 1.5 tabs x 3 days, then 1 tab x 3 days, then 0.5 tabs x 3 days 27 tablet 0   No facility-administered medications prior to visit.    No Known Allergies  ROS Review of Systems  Constitutional: Negative.   HENT: Negative.   Eyes: Negative.   Respiratory: Positive for cough (occasional ).   Cardiovascular: Negative.   Gastrointestinal: Negative.   Endocrine: Negative.   Genitourinary: Negative.   Musculoskeletal: Negative.   Skin: Negative.   Allergic/Immunologic: Negative.   Neurological: Positive for dizziness (occasional ) and headaches (occasional).  Hematological: Negative.   Psychiatric/Behavioral: Negative.       Objective:    Physical Exam  Constitutional: She is oriented to person, place, and time. She appears well-developed and well-nourished.  HENT:  Head: Normocephalic and atraumatic.  Eyes: Conjunctivae are normal.   Cardiovascular: Normal rate, regular rhythm, normal heart sounds and intact distal pulses.  Pulmonary/Chest: Effort normal and breath sounds normal.  Abdominal: Soft. Bowel sounds are normal.  Musculoskeletal:        General: Normal range of motion.     Cervical back: Normal range of motion and neck supple.  Neurological: She is alert and oriented to person, place, and time. She has normal reflexes.  Skin: Skin is warm and dry.  Psychiatric: She has a normal mood and affect. Her behavior is normal. Judgment and thought content normal.  Nursing note and vitals reviewed.   BP (!) 142/79 (BP Location: Right Arm, Patient Position: Sitting, Cuff Size: Normal)   Pulse 74  Temp 97.7 F (36.5 C) (Oral)   Resp 16   Ht 5\' 2"  (1.575 m)   Wt 158 lb (71.7 kg)   SpO2 97%   BMI 28.90 kg/m  Wt Readings from Last 3 Encounters:  12/01/19 158 lb (71.7 kg)  03/25/19 153 lb (69.4 kg)  09/30/18 148 lb 3.2 oz (67.2 kg)     Health Maintenance Due  Topic Date Due  . HIV Screening  11/29/1975  . MAMMOGRAM  11/28/2010  . COLONOSCOPY  11/28/2010    There are no preventive care reminders to display for this patient.  Lab Results  Component Value Date   TSH 1.290 09/02/2018   Lab Results  Component Value Date   WBC 6.6 09/02/2018   HGB 13.1 09/02/2018   HCT 38.7 09/02/2018   MCV 93 09/02/2018   PLT 397 09/02/2018   Lab Results  Component Value Date   NA 142 09/02/2018   K 4.8 09/02/2018   CO2 23 09/02/2018   GLUCOSE 84 09/02/2018   BUN 15 09/02/2018   CREATININE 0.67 09/02/2018   BILITOT 0.2 09/02/2018   ALKPHOS 97 09/02/2018   AST 24 09/02/2018   ALT 27 09/02/2018   PROT 7.1 09/02/2018   ALBUMIN 4.8 09/02/2018   CALCIUM 9.6 09/02/2018   Lab Results  Component Value Date   CHOL 188 09/02/2018   Lab Results  Component Value Date   HDL 75 09/02/2018   Lab Results  Component Value Date   LDLCALC 86 09/02/2018   Lab Results  Component Value Date   TRIG 134 09/02/2018    Lab Results  Component Value Date   CHOLHDL 2.5 09/02/2018   Lab Results  Component Value Date   HGBA1C 5.4 09/02/2018   Assessment & Plan:   1. Essential hypertension The current medical regimen is effective; blood pressure is stable at 142/79 today; continue present plan and medications as prescribed. She will continue to take medications as prescribed, to decrease high sodium intake, excessive alcohol intake, increase potassium intake, smoking cessation, and increase physical activity of at least 30 minutes of cardio activity daily. She will continue to follow Heart Healthy or DASH diet. - CBC with Differential - Comprehensive metabolic panel - Lipid Panel - TSH - Vitamin D, 25-hydroxy - Vitamin B12 - Urinalysis Dipstick - Hemoglobin A1c  2. Hyperlipidemia, unspecified hyperlipidemia type - CBC with Differential - Comprehensive metabolic panel - Lipid Panel - TSH - Vitamin D, 25-hydroxy - Vitamin B12 - Hemoglobin A1c  3. Bilateral lower extremity edema - furosemide (LASIX) 20 MG tablet; Take 1 tablet (20 mg total) by mouth daily. As needed for lower extremity edema  Dispense: 30 tablet; Refill: 3  4. Healthcare maintenance - CBC with Differential - Comprehensive metabolic panel - Lipid Panel - TSH - Vitamin D, 25-hydroxy - Vitamin B12 - Hemoglobin A1c  5. Follow up She will follow up in 6 months.   Meds ordered this encounter  Medications  . furosemide (LASIX) 20 MG tablet    Sig: Take 1 tablet (20 mg total) by mouth daily. As needed for lower extremity edema    Dispense:  30 tablet    Refill:  3    Orders Placed This Encounter  Procedures  . CBC with Differential  . Comprehensive metabolic panel  . Lipid Panel  . TSH  . Vitamin D, 25-hydroxy  . Vitamin B12  . Hemoglobin A1c  . Urinalysis Dipstick    Referral Orders  No referral(s) requested today  Raliegh Ip,  MSN, FNP-BC [facility] [address] [place], [place] [address] [phone] fax   Problem List Items Addressed This Visit      Cardiovascular and Mediastinum   Essential hypertension - Primary   Relevant Medications   furosemide (LASIX) 20 MG tablet   Other Relevant Orders   CBC with Differential   Comprehensive metabolic panel   Lipid Panel   TSH   Vitamin D, 25-hydroxy   Vitamin B12   Urinalysis Dipstick   Hemoglobin A1c    Other Visit Diagnoses    Hyperlipidemia, unspecified hyperlipidemia type       Relevant Medications   furosemide (LASIX) 20 MG tablet   Other Relevant Orders   CBC with Differential   Comprehensive metabolic panel   Lipid Panel   TSH   Vitamin D, 25-hydroxy   Vitamin B12   Hemoglobin A1c   Bilateral lower extremity edema       Relevant Medications   furosemide (LASIX) 20 MG tablet   Healthcare maintenance       Relevant Orders   CBC with Differential   Comprehensive metabolic panel   Lipid Panel   TSH   Vitamin D, 25-hydroxy   Vitamin B12   Hemoglobin A1c   Follow up          Meds ordered this encounter  Medications  . furosemide (LASIX) 20 MG tablet    Sig: Take 1 tablet (20 mg total) by mouth daily. As needed for lower extremity edema    Dispense:  30 tablet    Refill:  3    Follow-up: Return in about 6 months (around 05/30/2020).    Kallie Locks, FNP

## 2019-12-02 DIAGNOSIS — E785 Hyperlipidemia, unspecified: Secondary | ICD-10-CM | POA: Insufficient documentation

## 2019-12-02 DIAGNOSIS — R600 Localized edema: Secondary | ICD-10-CM | POA: Insufficient documentation

## 2019-12-02 LAB — COMPREHENSIVE METABOLIC PANEL
ALT: 31 IU/L (ref 0–32)
AST: 25 IU/L (ref 0–40)
Albumin/Globulin Ratio: 2.2 (ref 1.2–2.2)
Albumin: 5.1 g/dL — ABNORMAL HIGH (ref 3.8–4.9)
Alkaline Phosphatase: 115 IU/L (ref 39–117)
BUN/Creatinine Ratio: 25 — ABNORMAL HIGH (ref 9–23)
BUN: 16 mg/dL (ref 6–24)
Bilirubin Total: 0.4 mg/dL (ref 0.0–1.2)
CO2: 22 mmol/L (ref 20–29)
Calcium: 10.1 mg/dL (ref 8.7–10.2)
Chloride: 102 mmol/L (ref 96–106)
Creatinine, Ser: 0.63 mg/dL (ref 0.57–1.00)
GFR calc Af Amer: 114 mL/min/{1.73_m2} (ref >59)
GFR calc non Af Amer: 98 mL/min/{1.73_m2} (ref >59)
Globulin, Total: 2.3 g/dL (ref 1.5–4.5)
Glucose: 101 mg/dL — ABNORMAL HIGH (ref 65–99)
Potassium: 4.2 mmol/L (ref 3.5–5.2)
Sodium: 139 mmol/L (ref 134–144)
Total Protein: 7.4 g/dL (ref 6.0–8.5)

## 2019-12-02 LAB — CBC WITH DIFFERENTIAL/PLATELET
Basophils Absolute: 0.1 10*3/uL (ref 0.0–0.2)
Basos: 1 %
EOS (ABSOLUTE): 0.1 10*3/uL (ref 0.0–0.4)
Eos: 1 %
Hematocrit: 40.7 % (ref 34.0–46.6)
Hemoglobin: 14.3 g/dL (ref 11.1–15.9)
Immature Grans (Abs): 0 10*3/uL (ref 0.0–0.1)
Immature Granulocytes: 0 %
Lymphocytes Absolute: 1.7 10*3/uL (ref 0.7–3.1)
Lymphs: 23 %
MCH: 32.6 pg (ref 26.6–33.0)
MCHC: 35.1 g/dL (ref 31.5–35.7)
MCV: 93 fL (ref 79–97)
Monocytes Absolute: 0.5 10*3/uL (ref 0.1–0.9)
Monocytes: 8 %
Neutrophils Absolute: 4.7 10*3/uL (ref 1.4–7.0)
Neutrophils: 67 %
Platelets: 385 10*3/uL (ref 150–450)
RBC: 4.39 x10E6/uL (ref 3.77–5.28)
RDW: 12.1 % (ref 11.7–15.4)
WBC: 7.1 10*3/uL (ref 3.4–10.8)

## 2019-12-02 LAB — TSH: TSH: 1.62 u[IU]/mL (ref 0.450–4.500)

## 2019-12-02 LAB — VITAMIN B12: Vitamin B-12: 632 pg/mL (ref 232–1245)

## 2019-12-02 LAB — LIPID PANEL
Chol/HDL Ratio: 2.5 ratio (ref 0.0–4.4)
Cholesterol, Total: 214 mg/dL — ABNORMAL HIGH (ref 100–199)
HDL: 84 mg/dL (ref >39)
LDL Chol Calc (NIH): 107 mg/dL — ABNORMAL HIGH (ref 0–99)
Triglycerides: 134 mg/dL (ref 0–149)
VLDL Cholesterol Cal: 23 mg/dL (ref 5–40)

## 2019-12-02 LAB — VITAMIN D 25 HYDROXY (VIT D DEFICIENCY, FRACTURES): Vit D, 25-Hydroxy: 15.3 ng/mL — ABNORMAL LOW (ref 30.0–100.0)

## 2019-12-02 LAB — HEMOGLOBIN A1C
Est. average glucose Bld gHb Est-mCnc: 114 mg/dL
Hgb A1c MFr Bld: 5.6 % (ref 4.8–5.6)

## 2019-12-03 ENCOUNTER — Other Ambulatory Visit: Payer: Self-pay | Admitting: Family Medicine

## 2019-12-03 ENCOUNTER — Encounter: Payer: Self-pay | Admitting: Family Medicine

## 2019-12-03 DIAGNOSIS — E559 Vitamin D deficiency, unspecified: Secondary | ICD-10-CM

## 2019-12-03 MED ORDER — VITAMIN D (ERGOCALCIFEROL) 1.25 MG (50000 UNIT) PO CAPS: 50000.0000 [IU] | ORAL_CAPSULE | ORAL | 6 refills | Status: DC

## 2020-01-05 ENCOUNTER — Other Ambulatory Visit: Payer: Self-pay | Admitting: Family Medicine

## 2020-01-05 DIAGNOSIS — E785 Hyperlipidemia, unspecified: Secondary | ICD-10-CM

## 2020-01-05 DIAGNOSIS — I10 Essential (primary) hypertension: Secondary | ICD-10-CM

## 2020-02-22 ENCOUNTER — Telehealth: Payer: Self-pay | Admitting: Family Medicine

## 2020-02-22 NOTE — Telephone Encounter (Signed)
Pt wants to see if we could prescribed meds for a Sinus and ear infection she has. Please call pt back.

## 2020-02-23 ENCOUNTER — Encounter: Payer: Self-pay | Admitting: Family Medicine

## 2020-02-23 ENCOUNTER — Other Ambulatory Visit: Payer: Self-pay | Admitting: Family Medicine

## 2020-02-23 NOTE — Telephone Encounter (Signed)
Patient c/o sinus drainage, ear clogged. Patient advised check with her pharmacy for OTC treatments. Or  make an appointment for an in-office evaluation ASAP.

## 2020-02-23 NOTE — Telephone Encounter (Signed)
Pt called back again regarding possible medication to help her with her symptoms

## 2020-02-24 ENCOUNTER — Ambulatory Visit (INDEPENDENT_AMBULATORY_CARE_PROVIDER_SITE_OTHER): Payer: 59 | Admitting: Family Medicine

## 2020-02-24 ENCOUNTER — Other Ambulatory Visit: Payer: Self-pay

## 2020-02-24 ENCOUNTER — Encounter: Payer: Self-pay | Admitting: Family Medicine

## 2020-02-24 VITALS — BP 132/79 | HR 83 | Temp 98.0°F | Ht 62.0 in | Wt 159.8 lb

## 2020-02-24 DIAGNOSIS — E559 Vitamin D deficiency, unspecified: Secondary | ICD-10-CM

## 2020-02-24 DIAGNOSIS — I10 Essential (primary) hypertension: Secondary | ICD-10-CM

## 2020-02-24 DIAGNOSIS — H65112 Acute and subacute allergic otitis media (mucoid) (sanguinous) (serous), left ear: Secondary | ICD-10-CM | POA: Diagnosis not present

## 2020-02-24 DIAGNOSIS — J302 Other seasonal allergic rhinitis: Secondary | ICD-10-CM

## 2020-02-24 DIAGNOSIS — Z8701 Personal history of pneumonia (recurrent): Secondary | ICD-10-CM | POA: Diagnosis not present

## 2020-02-24 DIAGNOSIS — Z0000 Encounter for general adult medical examination without abnormal findings: Secondary | ICD-10-CM

## 2020-02-24 DIAGNOSIS — Z09 Encounter for follow-up examination after completed treatment for conditions other than malignant neoplasm: Secondary | ICD-10-CM

## 2020-02-24 DIAGNOSIS — H9202 Otalgia, left ear: Secondary | ICD-10-CM | POA: Diagnosis not present

## 2020-02-24 MED ORDER — AMOXICILLIN-POT CLAVULANATE 875-125 MG PO TABS: 1.0000 | ORAL_TABLET | Freq: Two times a day (BID) | ORAL | 0 refills | Status: AC

## 2020-02-24 MED ORDER — IPRATROPIUM BROMIDE 0.03 % NA SOLN: 2.0000 | Freq: Two times a day (BID) | NASAL | 11 refills | Status: DC

## 2020-02-24 MED ORDER — CETIRIZINE HCL 10 MG PO TABS: 10.0000 mg | ORAL_TABLET | Freq: Every day | ORAL | 11 refills | Status: DC

## 2020-02-24 NOTE — Patient Instructions (Signed)
Otitis Media, Adult  Otitis media means that the middle ear is red and swollen (inflamed) and full of fluid. The condition usually goes away on its own. Follow these instructions at home:  Take over-the-counter and prescription medicines only as told by your doctor.  If you were prescribed an antibiotic medicine, take it as told by your doctor. Do not stop taking the antibiotic even if you start to feel better.  Keep all follow-up visits as told by your doctor. This is important. Contact a doctor if:  You have bleeding from your nose.  There is a lump on your neck.  You are not getting better in 5 days.  You feel worse instead of better. Get help right away if:  You have pain that is not helped with medicine.  You have swelling, redness, or pain around your ear.  You get a stiff neck.  You cannot move part of your face (paralyzed).  You notice that the bone behind your ear hurts when you touch it.  You get a very bad headache. Summary  Otitis media means that the middle ear is red, swollen, and full of fluid.  This condition usually goes away on its own. In some cases, treatment may be needed.  If you were prescribed an antibiotic medicine, take it as told by your doctor. This information is not intended to replace advice given to you by your health care provider. Make sure you discuss any questions you have with your health care provider. Document Revised: 10/11/2017 Document Reviewed: 11/19/2016 Elsevier Patient Education  2020 Else[employer]. Amoxicillin; Clavulanic Acid Chewable Tablets What is this medicine? AMOXICILLIN; CLAVULANIC ACID (a mox i SIL in; KLAV yoo lan ic AS id) is a penicillin antibiotic. It treats some infections caused by bacteria. It will not work for colds, the flu, or other viruses. This medicine may be used for other purposes; ask your health care provider or pharmacist if you have questions. COMMON BRAND NAME(S): Augmentin What should I tell my  health care provider before I take this medicine? They need to know if you have any of these conditions:  bowel disease, like colitis  kidney disease  liver disease  mononucleosis  phenylketonuria  an unusual or allergic reaction to amoxicillin, penicillin, cephalosporin, other antibiotics, clavulanic acid, other medicines, foods, dyes, or preservatives  pregnant or trying to get pregnant  breast-feeding How should I use this medicine? Take this drug by mouth. Take it as directed on the prescription label at the same time every day. Chew or crush it completely before swallowing. Do not swallow tablets whole. You can take it with or without food. If it upsets your stomach, take it with food. Take all of this drug unless your health care provider tells you to stop it early. Keep taking it even if you think you are better. Talk to your health care provider about the use of this drug in children. While it may be prescribed for selected conditions, precautions do apply. Overdosage: If you think you have taken too much of this medicine contact a poison control center or emergency room at once. NOTE: This medicine is only for you. Do not share this medicine with others. What if I miss a dose? If you miss a dose, take it as soon as you can. If it is almost time for your next dose, take only that dose. Do not take double or extra doses. What may interact with this medicine?  allopurinol  anticoagulants  birth control  pills  methotrexate  probenecid This list may not describe all possible interactions. Give your health care provider a list of all the medicines, herbs, non-prescription drugs, or dietary supplements you use. Also tell them if you smoke, drink alcohol, or use illegal drugs. Some items may interact with your medicine. What should I watch for while using this medicine? Tell your doctor or healthcare provider if your symptoms do not improve. This medicine may cause serious  skin reactions. They can happen weeks to months after starting the medicine. Contact your healthcare provider right away if you notice fevers or flu-like symptoms with a rash. The rash may be red or purple and then turn into blisters or peeling of the skin. Or, you might notice a red rash with swelling of the face, lips or lymph nodes in your neck or under your arms. Do not treat diarrhea with over the counter products. Contact your doctor if you have diarrhea that lasts more than 2 days or if it is severe and watery. If you have diabetes, you may get a false-positive result for sugar in your urine. Check with your doctor or healthcare provider. Birth control pills may not work properly while you are taking this medicine. Talk to your doctor about using an extra method of birth control. What side effects may I notice from receiving this medicine? Side effects that you should report to your doctor or health care professional as soon as possible:  allergic reactions like skin rash, itching or hives, swelling of the face, lips, or tongue  breathing problems  dark urine  fever or chills, sore throat  redness, blistering, peeling, or loosening of the skin, including inside the mouth  seizures  trouble passing urine or change in the amount of urine  unusual bleeding, bruising  unusually weak or tired  white patches or sores in the mouth or throat Side effects that usually do not require medical attention (report to your doctor or health care professional if they continue or are bothersome):  diarrhea  dizziness  headache  nausea, vomiting  stomach upset  vaginal or anal irritation This list may not describe all possible side effects. Call your doctor for medical advice about side effects. You may report side effects to FDA at 1-800-FDA-1088. Where should I keep my medicine? Keep out of the reach of children and pets. Store at room temperature between 20 and 25 degrees C (68 and 77  degrees F). Throw away any unused drug after the expiration date. NOTE: This sheet is a summary. It may not cover all possible information. If you have questions about this medicine, talk to your doctor, pharmacist, or health care provider.  2020 Elsevier/Gold Standard (2019-07-13 08:58:41)

## 2020-02-24 NOTE — Progress Notes (Signed)
[facility]    Sick Visit  Subjective:  Patient ID: Sharon Christensen, female    DOB: Mar 23, 1961  Age: 59 y.o. MRN: [id number]  CC:  Chief Complaint  Patient presents with  . Sinus Problem    sinus drainage, left side facial pain  . Ear Pain    left ear pain    HPI Sharon Christensen is a 59 year old female who presents for Sick Visit today.   Past Medical History:  Diagnosis Date  . Bilateral lower extremity edema 11/2019  . History of mycoplasma pneumonia 10/2019  . Hyperlipidemia   . Hypertension   . Vitamin D deficiency 11/2019   Current Status: Since her last office visit, she has c/o left ear pain, congestion, and decreased hearing X 1 week now. She has used OTC ear drops, which have not been effective. She denies visual changes, chest pain, cough, shortness of breath, heart palpitations, and falls. She has occasional headaches and dizziness with position changes. Denies severe headaches, confusion, seizures, double vision, and blurred vision, nausea and vomiting. She denies fevers, chills, fatigue, recent infections, weight loss, and night sweats. Denies GI problems such as diarrhea, and constipation. She has no reports of blood in stools, dysuria and hematuria. No depression or anxiety reported today. She denies suicidal ideations, homicidal ideations, or auditory hallucinations. She is taking all medications as prescribed. She denies pain today.   History reviewed. No pertinent surgical history.  Family History  Problem Relation Age of Onset  . Diabetes Mother   . Cancer Father   . Diabetes Maternal Aunt     Social History   Socioeconomic History  . Marital status: Single    Spouse name: Not on file  . Number of children: Not on file  . Years of education: Not on file  . Highest education level: Not on file  Occupational History  . Not on file  Tobacco Use  . Smoking status: Current Every Day Smoker    Packs/day:  0.50    Types: Cigarettes  . Smokeless tobacco: Never Used  Substance and Sexual Activity  . Alcohol use: Yes    Alcohol/week: 2.0 standard drinks    Types: 2 Cans of beer per week  . Drug use: No  . Sexual activity: Not Currently  Other Topics Concern  . Not on file  Social History Narrative  . Not on file   Social Determinants of Health   Financial Resource Strain:   . Difficulty of Paying Living Expenses:   Food Insecurity:   . Worried About Programme researcher, broadcasting/film/video in the Last Year:   . Barista in the Last Year:   Transportation Needs:   . Freight forwarder (Medical):   Marland Kitchen Lack of Transportation (Non-Medical):   Physical Activity:   . Days of Exercise per Week:   . Minutes of Exercise per Session:   Stress:   . Feeling of Stress :   Social Connections:   . Frequency of Communication with Friends and Family:   . Frequency of Social Gatherings with Friends and Family:   . Attends Religious Services:   . Active Member of Clubs or Organizations:   . Attends Banker Meetings:   Marland Kitchen Marital Status:   Intimate Partner Violence:   . Fear of Current or Ex-Partner:   . Emotionally Abused:   Marland Kitchen Physically Abused:   . Sexually Abused:     Outpatient Medications Prior  to Visit  Medication Sig Dispense Refill  . albuterol (VENTOLIN HFA) 108 (90 Base) MCG/ACT inhaler Inhale 2 puffs into the lungs every 6 (six) hours as needed for wheezing or shortness of breath. 1 Inhaler 11  . amLODipine (NORVASC) 10 MG tablet Take 1 tablet by mouth once daily 90 tablet 0  . atorvastatin (LIPITOR) 40 MG tablet Take 1 tablet by mouth once daily 90 tablet 0  . diphenhydrAMINE (BENADRYL ALLERGY) 25 mg capsule Take 1 capsule (25 mg total) by mouth every 6 (six) hours as needed. 30 capsule 1  . furosemide (LASIX) 20 MG tablet Take 1 tablet (20 mg total) by mouth daily. As needed for lower extremity edema 30 tablet 3  . Vitamin D, Ergocalciferol, (DRISDOL) 1.25 MG (50000 UNIT) CAPS  capsule Take 1 capsule (50,000 Units total) by mouth every 7 (seven) days. 5 capsule 6  . Omega-3 Fatty Acids (FISH OIL) 1000 MG CAPS Take 3 capsules (3,000 mg total) by mouth daily. (Patient not taking: Reported on 02/24/2020) 90 capsule 2  . cetirizine (ZYRTEC) 10 MG tablet Take 1 tablet (10 mg total) by mouth daily. (Patient not taking: Reported on 12/01/2019) 30 tablet 11  . ipratropium (ATROVENT) 0.03 % nasal spray Place 2 sprays into both nostrils 2 (two) times daily. 30 mL 11   No facility-administered medications prior to visit.    No Known Allergies  ROS Review of Systems  Constitutional: Negative.   HENT: Positive for ear pain.        Left ear pain, swelling.  Eyes: Negative.   Respiratory: Negative.   Cardiovascular: Negative.   Gastrointestinal: Negative.   Endocrine: Negative.   Genitourinary: Negative.   Musculoskeletal: Negative.   Skin: Negative.   Allergic/Immunologic: Negative.   Neurological: Positive for dizziness (occasional ) and headaches (occasional ).  Hematological: Negative.   Psychiatric/Behavioral: Negative.       Objective:    Physical Exam  Constitutional: She is oriented to person, place, and time. She appears well-developed and well-nourished.  HENT:  Left Ear: There is swelling and tenderness. Tympanic membrane is erythematous. Decreased hearing is noted.  Eyes: Conjunctivae are normal.  Cardiovascular: Normal rate, regular rhythm, normal heart sounds and intact distal pulses.  Pulmonary/Chest: Effort normal and breath sounds normal.  Abdominal: Soft. Bowel sounds are normal.  Musculoskeletal:        General: Normal range of motion.     Cervical back: Normal range of motion and neck supple.  Neurological: She is alert and oriented to person, place, and time. She has normal reflexes.  Skin: Skin is warm and dry.  Psychiatric: She has a normal mood and affect. Her behavior is normal. Judgment and thought content normal.  Nursing note and  vitals reviewed.   BP 132/79   Pulse 83   Temp 98 F (36.7 C)   Ht 5\' 2"  (1.575 m)   Wt 159 lb 12.8 oz (72.5 kg)   SpO2 98%   BMI 29.23 kg/m  Wt Readings from Last 3 Encounters:  02/24/20 159 lb 12.8 oz (72.5 kg)  12/01/19 158 lb (71.7 kg)  03/25/19 153 lb (69.4 kg)     Health Maintenance Due  Topic Date Due  . HIV Screening  Never done  . MAMMOGRAM  Never done  . COLONOSCOPY  Never done    There are no preventive care reminders to display for this patient.  Lab Results  Component Value Date   TSH 1.620 12/01/2019   Lab Results  Component  Value Date   WBC 7.1 12/01/2019   HGB 14.3 12/01/2019   HCT 40.7 12/01/2019   MCV 93 12/01/2019   PLT 385 12/01/2019   Lab Results  Component Value Date   NA 139 12/01/2019   K 4.2 12/01/2019   CO2 22 12/01/2019   GLUCOSE 101 (H) 12/01/2019   BUN 16 12/01/2019   CREATININE 0.63 12/01/2019   BILITOT 0.4 12/01/2019   ALKPHOS 115 12/01/2019   AST 25 12/01/2019   ALT 31 12/01/2019   PROT 7.4 12/01/2019   ALBUMIN 5.1 (H) 12/01/2019   CALCIUM 10.1 12/01/2019   Lab Results  Component Value Date   CHOL 214 (H) 12/01/2019   Lab Results  Component Value Date   HDL 84 12/01/2019   Lab Results  Component Value Date   LDLCALC 107 (H) 12/01/2019   Lab Results  Component Value Date   TRIG 134 12/01/2019   Lab Results  Component Value Date   CHOLHDL 2.5 12/01/2019   Lab Results  Component Value Date   HGBA1C 5.6 12/01/2019      Assessment & Plan:   1. Acute allergic otitis media of left ear, recurrence not specified We will initiate antibiotic today. She will report to office if symptoms do not improve or worsen.  - amoxicillin-clavulanate (AUGMENTIN) 875-125 MG tablet; Take 1 tablet by mouth 2 (two) times daily for 7 days.  Dispense: 14 tablet; Refill: 0  2. Ear pain, left  3. Seasonal allergies We will initiate Cetirizine today.  - cetirizine (ZYRTEC) 10 MG tablet; Take 1 tablet (10 mg total) by mouth  daily. (Patient not taking: Reported on 02/24/2020)  Dispense: 30 tablet; Refill: 11 - ipratropium (ATROVENT) 0.03 % nasal spray; Place 2 sprays into both nostrils 2 (two) times daily. (Patient not taking: Reported on 02/24/2020)  Dispense: 30 mL; Refill: 11  4. History of pneumonia No signs or symptoms of respiratory distress noted or reported.   5. Essential hypertension The current medical regimen is effective; blood pressure is stable at 132/79 today; continue present plan and medications as prescribed. She will continue to take medications as prescribed, to decrease high sodium intake, excessive alcohol intake, increase potassium intake, smoking cessation, and increase physical activity of at least 30 minutes of cardio activity daily. She will continue to follow Heart Healthy or DASH diet.  6. Vitamin D deficiency  7. Healthcare maintenance  8. Follow up She will follow up in 3 months.    Meds ordered this encounter  Medications  . cetirizine (ZYRTEC) 10 MG tablet    Sig: Take 1 tablet (10 mg total) by mouth daily.    Dispense:  30 tablet    Refill:  11  . ipratropium (ATROVENT) 0.03 % nasal spray    Sig: Place 2 sprays into both nostrils 2 (two) times daily.    Dispense:  30 mL    Refill:  11  . amoxicillin-clavulanate (AUGMENTIN) 875-125 MG tablet    Sig: Take 1 tablet by mouth 2 (two) times daily for 7 days.    Dispense:  14 tablet    Refill:  0    No orders of the defined types were placed in this encounter.   Referral Orders  No referral(s) requested today    Raliegh Ip,  MSN, FNP-BC [facility] [address] [place], [place] [address] [phone] fax   Problem List Items Addressed This Visit  Cardiovascular and Mediastinum   Essential hypertension     Other   Seasonal allergies   Relevant Medications   cetirizine (ZYRTEC) 10 MG tablet   ipratropium (ATROVENT)  0.03 % nasal spray    Other Visit Diagnoses    Acute allergic otitis media of left ear, recurrence not specified    -  Primary   Relevant Medications   amoxicillin-clavulanate (AUGMENTIN) 875-125 MG tablet   Ear pain, left       History of pneumonia       Vitamin D deficiency       Healthcare maintenance       Follow up          Meds ordered this encounter  Medications  . cetirizine (ZYRTEC) 10 MG tablet    Sig: Take 1 tablet (10 mg total) by mouth daily.    Dispense:  30 tablet    Refill:  11  . ipratropium (ATROVENT) 0.03 % nasal spray    Sig: Place 2 sprays into both nostrils 2 (two) times daily.    Dispense:  30 mL    Refill:  11  . amoxicillin-clavulanate (AUGMENTIN) 875-125 MG tablet    Sig: Take 1 tablet by mouth 2 (two) times daily for 7 days.    Dispense:  14 tablet    Refill:  0    Follow-up: Return in about 3 months (around 05/25/2020).    Kallie Locks, FNP

## 2020-02-25 DIAGNOSIS — E559 Vitamin D deficiency, unspecified: Secondary | ICD-10-CM | POA: Insufficient documentation

## 2020-02-25 DIAGNOSIS — H65112 Acute and subacute allergic otitis media (mucoid) (sanguinous) (serous), left ear: Secondary | ICD-10-CM | POA: Insufficient documentation

## 2020-02-25 DIAGNOSIS — H9202 Otalgia, left ear: Secondary | ICD-10-CM | POA: Insufficient documentation

## 2020-04-12 ENCOUNTER — Other Ambulatory Visit: Payer: Self-pay | Admitting: Family Medicine

## 2020-04-12 DIAGNOSIS — I10 Essential (primary) hypertension: Secondary | ICD-10-CM

## 2020-05-12 DIAGNOSIS — R062 Wheezing: Secondary | ICD-10-CM

## 2020-05-12 HISTORY — DX: Wheezing: R06.2

## 2020-05-30 ENCOUNTER — Other Ambulatory Visit: Payer: Self-pay

## 2020-05-30 ENCOUNTER — Ambulatory Visit (INDEPENDENT_AMBULATORY_CARE_PROVIDER_SITE_OTHER): Payer: 59 | Admitting: Family Medicine

## 2020-05-30 ENCOUNTER — Encounter: Payer: Self-pay | Admitting: Family Medicine

## 2020-05-30 VITALS — BP 140/87 | HR 82 | Temp 98.4°F | Resp 17 | Ht 62.0 in | Wt 153.6 lb

## 2020-05-30 DIAGNOSIS — R059 Cough, unspecified: Secondary | ICD-10-CM

## 2020-05-30 DIAGNOSIS — S46911A Strain of unspecified muscle, fascia and tendon at shoulder and upper arm level, right arm, initial encounter: Secondary | ICD-10-CM | POA: Diagnosis not present

## 2020-05-30 DIAGNOSIS — R600 Localized edema: Secondary | ICD-10-CM

## 2020-05-30 DIAGNOSIS — R062 Wheezing: Secondary | ICD-10-CM

## 2020-05-30 DIAGNOSIS — R0602 Shortness of breath: Secondary | ICD-10-CM | POA: Diagnosis not present

## 2020-05-30 DIAGNOSIS — I10 Essential (primary) hypertension: Secondary | ICD-10-CM

## 2020-05-30 DIAGNOSIS — Z09 Encounter for follow-up examination after completed treatment for conditions other than malignant neoplasm: Secondary | ICD-10-CM

## 2020-05-30 DIAGNOSIS — R05 Cough: Secondary | ICD-10-CM

## 2020-05-30 LAB — POCT URINALYSIS DIPSTICK
Bilirubin, UA: NEGATIVE
Blood, UA: NEGATIVE
Glucose, UA: NEGATIVE
Ketones, UA: NEGATIVE
Leukocytes, UA: NEGATIVE
Nitrite, UA: NEGATIVE
Protein, UA: POSITIVE — AB
Spec Grav, UA: >=1.03 — AB (ref 1.010–1.025)
Urobilinogen, UA: 0.2 E.U./dL
pH, UA: 6 (ref 5.0–8.0)

## 2020-05-30 MED ORDER — ALBUTEROL SULFATE HFA 108 (90 BASE) MCG/ACT IN AERS: 2.0000 | INHALATION_SPRAY | Freq: Four times a day (QID) | RESPIRATORY_TRACT | 11 refills | Status: AC | PRN

## 2020-05-30 MED ORDER — FUROSEMIDE 20 MG PO TABS: 20.0000 mg | ORAL_TABLET | Freq: Every day | ORAL | 3 refills | Status: DC

## 2020-05-30 MED ORDER — AMLODIPINE BESYLATE 10 MG PO TABS: 10.0000 mg | ORAL_TABLET | Freq: Every day | ORAL | 3 refills | Status: DC

## 2020-05-30 MED ORDER — CYCLOBENZAPRINE HCL 10 MG PO TABS: 10.0000 mg | ORAL_TABLET | Freq: Three times a day (TID) | ORAL | 2 refills | Status: DC | PRN

## 2020-05-30 NOTE — Patient Instructions (Signed)
Cyclobenzaprine tablets What is this medicine? CYCLOBENZAPRINE (sye kloe BEN za preen) is a muscle relaxer. It is used to treat muscle pain, spasms, and stiffness. This medicine may be used for other purposes; ask your health care provider or pharmacist if you have questions. COMMON BRAND NAME(S): Fexmid, Flexeril What should I tell my health care provider before I take this medicine? They need to know if you have any of these conditions:  heart disease, irregular heartbeat, or previous heart attack  liver disease  thyroid problem  an unusual or allergic reaction to cyclobenzaprine, tricyclic antidepressants, lactose, other medicines, foods, dyes, or preservatives  pregnant or trying to get pregnant  breast-feeding How should I use this medicine? Take this medicine by mouth with a glass of water. Follow the directions on the prescription label. If this medicine upsets your stomach, take it with food or milk. Take your medicine at regular intervals. Do not take it more often than directed. Talk to your pediatrician regarding the use of this medicine in children. Special care may be needed. Overdosage: If you think you have taken too much of this medicine contact a poison control center or emergency room at once. NOTE: This medicine is only for you. Do not share this medicine with others. What if I miss a dose? If you miss a dose, take it as soon as you can. If it is almost time for your next dose, take only that dose. Do not take double or extra doses. What may interact with this medicine? Do not take this medicine with any of the following medications:  MAOIs like Carbex, Eldepryl, Marplan, Nardil, and Parnate  narcotic medicines for cough  safinamide This medicine may also interact with the following medications:  alcohol  bupropion  antihistamines for allergy, cough and cold  certain medicines for anxiety or sleep  certain medicines for bladder problems like oxybutynin,  tolterodine  certain medicines for depression like amitriptyline, fluoxetine, sertraline  certain medicines for Parkinson's disease like benztropine, trihexyphenidyl  certain medicines for seizures like phenobarbital, primidone  certain medicines for stomach problems like dicyclomine, hyoscyamine  certain medicines for travel sickness like scopolamine  general anesthetics like halothane, isoflurane, methoxyflurane, propofol  ipratropium  local anesthetics like lidocaine, pramoxine, tetracaine  medicines that relax muscles for surgery  narcotic medicines for pain  phenothiazines like chlorpromazine, mesoridazine, prochlorperazine, thioridazine  verapamil This list may not describe all possible interactions. Give your health care provider a list of all the medicines, herbs, non-prescription drugs, or dietary supplements you use. Also tell them if you smoke, drink alcohol, or use illegal drugs. Some items may interact with your medicine. What should I watch for while using this medicine? Tell your doctor or health care professional if your symptoms do not start to get better or if they get worse. You may get drowsy or dizzy. Do not drive, use machinery, or do anything that needs mental alertness until you know how this medicine affects you. Do not stand or sit up quickly, especially if you are an older patient. This reduces the risk of dizzy or fainting spells. Alcohol may interfere with the effect of this medicine. Avoid alcoholic drinks. If you are taking another medicine that also causes drowsiness, you may have more side effects. Give your health care provider a list of all medicines you use. Your doctor will tell you how much medicine to take. Do not take more medicine than directed. Call emergency for help if you have problems breathing or unusual sleepiness.  Your mouth may get dry. Chewing sugarless gum or sucking hard candy, and drinking plenty of water may help. Contact your  doctor if the problem does not go away or is severe. What side effects may I notice from receiving this medicine? Side effects that you should report to your doctor or health care professional as soon as possible:  allergic reactions like skin rash, itching or hives, swelling of the face, lips, or tongue  breathing problems  chest pain  fast, irregular heartbeat  hallucinations  seizures  unusually weak or tired Side effects that usually do not require medical attention (report to your doctor or health care professional if they continue or are bothersome):  headache  nausea, vomiting This list may not describe all possible side effects. Call your doctor for medical advice about side effects. You may report side effects to FDA at 1-800-FDA-1088. Where should I keep my medicine? Keep out of the reach of children. Store at room temperature between 15 and 30 degrees C (59 and 86 degrees F). Keep container tightly closed. Throw away any unused medicine after the expiration date. NOTE: This sheet is a summary. It may not cover all possible information. If you have questions about this medicine, talk to your doctor, pharmacist, or health care provider.  2020 Elsevier/Gold Standard (2018-10-01 12:49:26) Muscle Strain A muscle strain is an injury that happens when a muscle is stretched longer than normal. This can happen during a fall, sports, or lifting. This can tear some muscle fibers. Usually, recovery from muscle strain takes 1-2 weeks. Complete healing normally takes 5-6 weeks. This condition is first treated with PRICE therapy. This involves:  Protecting your muscle from being injured again.  Resting your injured muscle.  Icing your injured muscle.  Applying pressure (compression) to your injured muscle. This may be done with a splint or elastic bandage.  Raising (elevating) your injured muscle. Your doctor may also recommend medicine for pain. Follow these instructions at  home: If you have a splint:  Wear the splint as told by your doctor. Take it off only as told by your doctor.  Loosen the splint if your fingers or toes tingle, get numb, or turn cold and blue.  Keep the splint clean.  If the splint is not waterproof: ? Do not let it get wet. ? Cover it with a watertight covering when you take a bath or a shower. Managing pain, stiffness, and swelling   If directed, put ice on your injured area. ? If you have a removable splint, take it off as told by your doctor. ? Put ice in a plastic bag. ? Place a towel between your skin and the bag. ? Leave the ice on for 20 minutes, 2-3 times a day.  Move your fingers or toes often. This helps to avoid stiffness and lessen swelling.  Raise your injured area above the level of your heart while you are sitting or lying down.  Wear an elastic bandage as told by your doctor. Make sure it is not too tight. General instructions  Take over-the-counter and prescription medicines only as told by your doctor.  Limit your activity. Rest your injured muscle as told by your doctor. Your doctor may say that gentle movements are okay.  If physical therapy was prescribed, do exercises as told by your doctor.  Do not put pressure on any part of the splint until it is fully hardened. This may take many hours.  Do not use any products that contain  nicotine or tobacco, such as cigarettes and e-cigarettes. These can delay bone healing. If you need help quitting, ask your doctor.  Warm up before you exercise. This helps to prevent more muscle strains.  Ask your doctor when it is safe to drive if you have a splint.  Keep all follow-up visits as told by your doctor. This is important. Contact a doctor if:  You have more pain or swelling in your injured area. Get help right away if:  You have any of these problems in your injured area: ? You have numbness. ? You have tingling. ? You lose a lot of  strength. Summary  A muscle strain is an injury that happens when a muscle is stretched longer than normal.  This condition is first treated with PRICE therapy. This includes protecting, resting, icing, adding pressure, and raising your injury.  Limit your activity. Rest your injured muscle as told by your doctor. Your doctor may say that gentle movements are okay.  Warm up before you exercise. This helps to prevent more muscle strains. This information is not intended to replace advice given to you by your health care provider. Make sure you discuss any questions you have with your health care provider. Document Revised: 12/25/2018 Document Reviewed: 12/05/2016 Elsevier Patient Education  2020 [employer].

## 2020-05-30 NOTE — Progress Notes (Signed)
[facility]   Established Patient Office Visit  Subjective:  Patient ID: Sharon Christensen, female    DOB: Mar 13, 1961  Age: 59 y.o. MRN: [id number]  CC:  Chief Complaint  Patient presents with  . Follow-up    Pt states she is here for her 6 months f/u.  Pt states there are no question or concerns she wasnt s discuss today.    HPI Sharon Christensen is a 59 year old female who presents for Follow Up today    Patient Active Problem List   Diagnosis Date Noted  . Acute allergic otitis media of left ear 02/25/2020  . Ear pain, left 02/25/2020  . Vitamin D deficiency 02/25/2020  . Hyperlipidemia 12/02/2019  . Bilateral lower extremity edema 12/02/2019  . Essential hypertension 03/26/2019  . Shortness of breath 03/26/2019  . Seasonal allergies 03/26/2019    Past Medical History:  Diagnosis Date  . Bilateral lower extremity edema 11/2019  . History of mycoplasma pneumonia 10/2019  . Hyperlipidemia   . Hypertension   . Vitamin D deficiency 11/2019   Current Status: Since her last office visit, she has c/o right arm pain X 2 weeks since she recently moved. She believes that she may have pulled a muscle in her arm. She is not taking any medication for relief of symptoms at this time. She denies visual changes, chest pain, cough, shortness of breath, heart palpitations, and falls. She has occasional headaches and dizziness with position changes. Denies severe headaches, confusion, seizures, double vision, and blurred vision, nausea and vomiting. She denies fevers, chills, fatigue, recent infections, weight loss, and night sweats. Denies GI problems such as diarrhea, and constipation. She has no reports of blood in stools, dysuria and hematuria. No depression or anxiety reported today. She is taking all medications as prescribed.  History reviewed. No pertinent surgical history.  Family History  Problem Relation Age of Onset  . Diabetes  Mother   . Cancer Father   . Diabetes Maternal Aunt     Social History   Socioeconomic History  . Marital status: Single    Spouse name: Not on file  . Number of children: Not on file  . Years of education: Not on file  . Highest education level: Not on file  Occupational History  . Not on file  Tobacco Use  . Smoking status: Current Every Day Smoker    Packs/day: 0.50    Types: Cigarettes  . Smokeless tobacco: Never Used  Vaping Use  . Vaping Use: Never used  Substance and Sexual Activity  . Alcohol use: Yes    Alcohol/week: 2.0 standard drinks    Types: 2 Cans of beer per week  . Drug use: No  . Sexual activity: Not Currently  Other Topics Concern  . Not on file  Social History Narrative  . Not on file   Social Determinants of Health   Financial Resource Strain:   . Difficulty of Paying Living Expenses:   Food Insecurity:   . Worried About Programme researcher, broadcasting/film/video in the Last Year:   . Barista in the Last Year:   Transportation Needs:   . Freight forwarder (Medical):   Marland Kitchen Lack of Transportation (Non-Medical):   Physical Activity:   . Days of Exercise per Week:   . Minutes of Exercise per Session:   Stress:   . Feeling of Stress :   Social Connections:   . Frequency of  Communication with Friends and Family:   . Frequency of Social Gatherings with Friends and Family:   . Attends Religious Services:   . Active Member of Clubs or Organizations:   . Attends BankerClub or Organization Meetings:   Marland Kitchen. Marital Status:   Intimate Partner Violence:   . Fear of Current or Ex-Partner:   . Emotionally Abused:   Marland Kitchen. Physically Abused:   . Sexually Abused:     Outpatient Medications Prior to Visit  Medication Sig Dispense Refill  . atorvastatin (LIPITOR) 40 MG tablet Take 1 tablet by mouth once daily 90 tablet 0  . cetirizine (ZYRTEC) 10 MG tablet Take 1 tablet (10 mg total) by mouth daily. 30 tablet 11  . diphenhydrAMINE (BENADRYL ALLERGY) 25 mg capsule Take 1  capsule (25 mg total) by mouth every 6 (six) hours as needed. 30 capsule 1  . ipratropium (ATROVENT) 0.03 % nasal spray Place 2 sprays into both nostrils 2 (two) times daily. 30 mL 11  . Vitamin D, Ergocalciferol, (DRISDOL) 1.25 MG (50000 UNIT) CAPS capsule Take 1 capsule (50,000 Units total) by mouth every 7 (seven) days. 5 capsule 6  . albuterol (VENTOLIN HFA) 108 (90 Base) MCG/ACT inhaler Inhale 2 puffs into the lungs every 6 (six) hours as needed for wheezing or shortness of breath. 1 Inhaler 11  . amLODipine (NORVASC) 10 MG tablet Take 1 tablet by mouth once daily 90 tablet 0  . furosemide (LASIX) 20 MG tablet Take 1 tablet (20 mg total) by mouth daily. As needed for lower extremity edema 30 tablet 3  . Omega-3 Fatty Acids (FISH OIL) 1000 MG CAPS Take 3 capsules (3,000 mg total) by mouth daily. (Patient not taking: Reported on 02/24/2020) 90 capsule 2   No facility-administered medications prior to visit.    No Known Allergies  ROS Review of Systems  Constitutional: Negative.   HENT: Negative.   Eyes: Negative.   Respiratory: Negative.   Cardiovascular: Negative.   Gastrointestinal: Negative.   Endocrine: Negative.   Genitourinary: Negative.   Musculoskeletal: Positive for arthralgias (generalized joint pain) and back pain.       Acute upper right arm pain  Skin: Negative.   Allergic/Immunologic: Negative.   Neurological: Positive for dizziness (occasional ) and headaches (occasional ).  Hematological: Negative.   Psychiatric/Behavioral: Negative.       Objective:    Physical Exam Vitals and nursing note reviewed.  Constitutional:      Appearance: Normal appearance.  HENT:     Head: Normocephalic and atraumatic.     Nose: Nose normal.     Mouth/Throat:     Mouth: Mucous membranes are moist.     Pharynx: Oropharynx is clear.  Cardiovascular:     Rate and Rhythm: Normal rate and regular rhythm.     Pulses: Normal pulses.     Heart sounds: Normal heart sounds.   Pulmonary:     Effort: Pulmonary effort is normal.     Breath sounds: Wheezing (upper right lung) present.  Abdominal:     General: Bowel sounds are normal.     Palpations: Abdomen is soft.  Musculoskeletal:     Cervical back: Normal range of motion and neck supple.     Comments: Limited ROM in right arm, r/t muscle strain  Skin:    General: Skin is warm and dry.  Neurological:     General: No focal deficit present.     Mental Status: She is alert and oriented to person, place, and time.  Psychiatric:        Mood and Affect: Mood normal.        Behavior: Behavior normal.        Thought Content: Thought content normal.        Judgment: Judgment normal.     BP 140/87 (BP Location: Left Arm, Patient Position: Sitting, Cuff Size: Normal)   Pulse 82   Temp 98.4 F (36.9 C)   Resp 17   Ht 5\' 2"  (1.575 m)   Wt 153 lb 9.6 oz (69.7 kg)   SpO2 98%   BMI 28.09 kg/m  Wt Readings from Last 3 Encounters:  05/30/20 153 lb 9.6 oz (69.7 kg)  02/24/20 159 lb 12.8 oz (72.5 kg)  12/01/19 158 lb (71.7 kg)     Health Maintenance Due  Topic Date Due  . COVID-19 Vaccine (1) Never done  . HIV Screening  Never done  . MAMMOGRAM  Never done  . COLONOSCOPY  Never done  . PAP SMEAR-Modifier  07/11/2020    There are no preventive care reminders to display for this patient.  Lab Results  Component Value Date   TSH 1.620 12/01/2019   Lab Results  Component Value Date   WBC 7.1 12/01/2019   HGB 14.3 12/01/2019   HCT 40.7 12/01/2019   MCV 93 12/01/2019   PLT 385 12/01/2019   Lab Results  Component Value Date   NA 139 12/01/2019   K 4.2 12/01/2019   CO2 22 12/01/2019   GLUCOSE 101 (H) 12/01/2019   BUN 16 12/01/2019   CREATININE 0.63 12/01/2019   BILITOT 0.4 12/01/2019   ALKPHOS 115 12/01/2019   AST 25 12/01/2019   ALT 31 12/01/2019   PROT 7.4 12/01/2019   ALBUMIN 5.1 (H) 12/01/2019   CALCIUM 10.1 12/01/2019   Lab Results  Component Value Date   CHOL 214 (H) 12/01/2019    Lab Results  Component Value Date   HDL 84 12/01/2019   Lab Results  Component Value Date   LDLCALC 107 (H) 12/01/2019   Lab Results  Component Value Date   TRIG 134 12/01/2019   Lab Results  Component Value Date   CHOLHDL 2.5 12/01/2019   Lab Results  Component Value Date   HGBA1C 5.6 12/01/2019      Assessment & Plan:   1. Muscle strain of right upper arm, initial encounter We will initiate medications for muscle spasms today.  - cyclobenzaprine (FLEXERIL) 10 MG tablet; Take 1 tablet (10 mg total) by mouth 3 (three) times daily as needed for muscle spasms.  Dispense: 30 tablet; Refill: 2  2. Essential hypertension The current medical regimen is effective; blood pressure is stable at 140/87 today; continue present plan and medications as prescribed. She will continue to take medications as prescribed, to decrease high sodium intake, excessive alcohol intake, increase potassium intake, smoking cessation, and increase physical activity of at least 30 minutes of cardio activity daily. She will continue to follow Heart Healthy or DASH diet. - Urinalysis Dipstick - amLODipine (NORVASC) 10 MG tablet; Take 1 tablet (10 mg total) by mouth daily.  Dispense: 90 tablet; Refill: 3  3. Cough - albuterol (VENTOLIN HFA) 108 (90 Base) MCG/ACT inhaler; Inhale 2 puffs into the lungs every 6 (six) hours as needed for wheezing or shortness of breath.  Dispense: 18 g; Refill: 11  4. Shortness of breath Stable. No signs or symptoms of respiratory distress noted or reported today.  - albuterol (VENTOLIN HFA) 108 (90 Base) MCG/ACT inhaler;  Inhale 2 puffs into the lungs every 6 (six) hours as needed for wheezing or shortness of breath.  Dispense: 18 g; Refill: 11  5. Bilateral lower extremity edema - furosemide (LASIX) 20 MG tablet; Take 1 tablet (20 mg total) by mouth daily. As needed for lower extremity edema  Dispense: 90 tablet; Refill: 3  6. Wheezes Upper right lung lobe. No signs or  symptoms of respiratory distress noted or reported today. She will continue Albuterol inhaler as needed.   7. Follow up She will follow up in 1 year.   Meds ordered this encounter  Medications  . albuterol (VENTOLIN HFA) 108 (90 Base) MCG/ACT inhaler    Sig: Inhale 2 puffs into the lungs every 6 (six) hours as needed for wheezing or shortness of breath.    Dispense:  18 g    Refill:  11  . amLODipine (NORVASC) 10 MG tablet    Sig: Take 1 tablet (10 mg total) by mouth daily.    Dispense:  90 tablet    Refill:  3  . furosemide (LASIX) 20 MG tablet    Sig: Take 1 tablet (20 mg total) by mouth daily. As needed for lower extremity edema    Dispense:  90 tablet    Refill:  3  . cyclobenzaprine (FLEXERIL) 10 MG tablet    Sig: Take 1 tablet (10 mg total) by mouth 3 (three) times daily as needed for muscle spasms.    Dispense:  30 tablet    Refill:  2    Orders Placed This Encounter  Procedures  . Urinalysis Dipstick    Referral Orders  No referral(s) requested today    Raliegh Ip,  MSN, FNP-BC [facility] [address] [place], [place] [address] [phone] fax   Problem List Items Addressed This Visit      Cardiovascular and Mediastinum   Essential hypertension   Relevant Medications   amLODipine (NORVASC) 10 MG tablet   furosemide (LASIX) 20 MG tablet   Other Relevant Orders   Urinalysis Dipstick     Other   Bilateral lower extremity edema   Relevant Medications   furosemide (LASIX) 20 MG tablet   Shortness of breath   Relevant Medications   albuterol (VENTOLIN HFA) 108 (90 Base) MCG/ACT inhaler    Other Visit Diagnoses    Muscle strain of right upper arm, initial encounter    -  Primary   Relevant Medications   cyclobenzaprine (FLEXERIL) 10 MG tablet   Cough       Relevant Medications   albuterol (VENTOLIN HFA) 108 (90 Base) MCG/ACT inhaler   Wheezes        Follow up          Meds ordered this encounter  Medications  . albuterol (VENTOLIN HFA) 108 (90 Base) MCG/ACT inhaler    Sig: Inhale 2 puffs into the lungs every 6 (six) hours as needed for wheezing or shortness of breath.    Dispense:  18 g    Refill:  11  . amLODipine (NORVASC) 10 MG tablet    Sig: Take 1 tablet (10 mg total) by mouth daily.    Dispense:  90 tablet    Refill:  3  . furosemide (LASIX) 20 MG tablet    Sig: Take 1 tablet (20 mg total) by mouth daily. As needed for lower extremity edema    Dispense:  90 tablet    Refill:  3  . cyclobenzaprine (FLEXERIL) 10 MG tablet    Sig: Take 1 tablet (10 mg total) by mouth 3 (three) times daily as needed for muscle spasms.    Dispense:  30 tablet    Refill:  2    Follow-up: Return in about 1 year (around 05/30/2021).    Kallie Locks, FNP

## 2020-06-01 ENCOUNTER — Encounter: Payer: Self-pay | Admitting: Family Medicine

## 2020-06-01 DIAGNOSIS — R062 Wheezing: Secondary | ICD-10-CM | POA: Insufficient documentation

## 2020-06-24 ENCOUNTER — Other Ambulatory Visit: Payer: Self-pay | Admitting: Family Medicine

## 2020-06-24 DIAGNOSIS — E785 Hyperlipidemia, unspecified: Secondary | ICD-10-CM

## 2020-06-27 ENCOUNTER — Other Ambulatory Visit: Payer: Self-pay | Admitting: Family Medicine

## 2020-06-27 ENCOUNTER — Telehealth: Payer: Self-pay | Admitting: Family Medicine

## 2020-06-27 DIAGNOSIS — E785 Hyperlipidemia, unspecified: Secondary | ICD-10-CM

## 2020-06-27 MED ORDER — ATORVASTATIN CALCIUM 40 MG PO TABS: 40.0000 mg | ORAL_TABLET | Freq: Every day | ORAL | 3 refills | Status: DC

## 2020-06-27 NOTE — Telephone Encounter (Signed)
Med refill for atorvastatin 40 mg

## 2020-06-29 ENCOUNTER — Other Ambulatory Visit: Payer: Self-pay | Admitting: Family Medicine

## 2020-06-29 DIAGNOSIS — Z0000 Encounter for general adult medical examination without abnormal findings: Secondary | ICD-10-CM

## 2020-06-29 NOTE — Telephone Encounter (Signed)
Did you order this medicine already.Sharon Christensen

## 2020-09-21 ENCOUNTER — Telehealth: Payer: Self-pay | Admitting: Family Medicine

## 2020-09-21 NOTE — Telephone Encounter (Signed)
Pt was contacted concerning AWV w/ [facility]. Lvm to call back

## 2020-11-22 ENCOUNTER — Encounter: Payer: Self-pay | Admitting: Family Medicine

## 2020-11-22 ENCOUNTER — Telehealth (INDEPENDENT_AMBULATORY_CARE_PROVIDER_SITE_OTHER): Payer: 59 | Admitting: Family Medicine

## 2020-11-22 DIAGNOSIS — R059 Cough, unspecified: Secondary | ICD-10-CM | POA: Diagnosis not present

## 2020-11-22 DIAGNOSIS — I10 Essential (primary) hypertension: Secondary | ICD-10-CM

## 2020-11-22 DIAGNOSIS — B349 Viral infection, unspecified: Secondary | ICD-10-CM

## 2020-11-22 DIAGNOSIS — Z09 Encounter for follow-up examination after completed treatment for conditions other than malignant neoplasm: Secondary | ICD-10-CM

## 2020-11-22 DIAGNOSIS — R0989 Other specified symptoms and signs involving the circulatory and respiratory systems: Secondary | ICD-10-CM | POA: Diagnosis not present

## 2020-11-22 MED ORDER — BENZONATATE 100 MG PO CAPS: 100.0000 mg | ORAL_CAPSULE | Freq: Two times a day (BID) | ORAL | 0 refills | Status: DC | PRN

## 2020-11-22 MED ORDER — AZITHROMYCIN 250 MG PO TABS: ORAL_TABLET | ORAL | 0 refills | Status: DC

## 2020-11-22 NOTE — Progress Notes (Signed)
Virtual Visit via Telephone Note  I connected with Sharon Christensen on 11/22/20 at  3:20 PM EST by telephone and verified that I am speaking with the correct person using two identifiers.  Location: Patient: Home Provider: Office   I discussed the limitations, risks, security and privacy concerns of performing an evaluation and management service by telephone and the availability of in person appointments. I also discussed with the patient that there may be a patient responsible charge related to this service. The patient expressed understanding and agreed to proceed.   History of Present Illness:  Patient Active Problem List   Diagnosis Date Noted  . Wheezes 06/01/2020  . Acute allergic otitis media of left ear 02/25/2020  . Ear pain, left 02/25/2020  . Vitamin D deficiency 02/25/2020  . Hyperlipidemia 12/02/2019  . Bilateral lower extremity edema 12/02/2019  . Essential hypertension 03/26/2019  . Shortness of breath 03/26/2019  . Seasonal allergies 03/26/2019   Current Status: Since her last office visit, She is doing well with no complaints. She has c/o cough and chest congestion X 2 weeks now. She has been taking OTC cold remedies with no relief of symptoms. She denies fevers, chills, fatigue, recent infections, weight loss, and night sweats. She has not had any headaches, visual changes, dizziness, and falls. No chest pain, heart palpitations, cough and shortness of breath reported. Denies GI problems such as nausea, vomiting, diarrhea, and constipation. She has no reports of blood in stools, dysuria and hematuria. No depression or anxiety, and denies suicidal ideations, homicidal ideations, or auditory hallucinations. She is taking all medications as prescribed. She denies pain today.   Observations/Objective:  Telephone Virtual Visit   Assessment and Plan:  1. Viral infection  2. Chest congestion - azithromycin (ZITHROMAX) 250 MG tablet; Take 2 tablets (500 mg) by mouth,  now. Then take 1 tablet (250 mg) by mouth, daily X 4 days until complete.  Dispense: 6 tablet; Refill: 0 - benzonatate (TESSALON) 100 MG capsule; Take 1 capsule (100 mg total) by mouth 2 (two) times daily as needed for cough.  Dispense: 20 capsule; Refill: 0  3. Cough - azithromycin (ZITHROMAX) 250 MG tablet; Take 2 tablets (500 mg) by mouth, now. Then take 1 tablet (250 mg) by mouth, daily X 4 days until complete.  Dispense: 6 tablet; Refill: 0 - benzonatate (TESSALON) 100 MG capsule; Take 1 capsule (100 mg total) by mouth 2 (two) times daily as needed for cough.  Dispense: 20 capsule; Refill: 0  4. Essential hypertension She will continue to take medications as prescribed, to decrease high sodium intake, excessive alcohol intake, increase potassium intake, smoking cessation, and increase physical activity of at least 30 minutes of cardio activity daily. She will continue to follow Heart Healthy or DASH diet.  5. Follow up She will keep follow up appointment 02/2021.   Meds ordered this encounter  Medications  . azithromycin (ZITHROMAX) 250 MG tablet    Sig: Take 2 tablets (500 mg) by mouth, now. Then take 1 tablet (250 mg) by mouth, daily X 4 days until complete.    Dispense:  6 tablet    Refill:  0  . benzonatate (TESSALON) 100 MG capsule    Sig: Take 1 capsule (100 mg total) by mouth 2 (two) times daily as needed for cough.    Dispense:  20 capsule    Refill:  0    No orders of the defined types were placed in this encounter.   Referral Orders  No  referral(s) requested today    Raliegh Ip, MSN, ANE, FNP-BC [facility] [address] [place], [place] [address] [phone] fax         I discussed the assessment and treatment plan with the patient. The patient was provided an opportunity to ask questions and all were answered. The patient agreed with the plan and  demonstrated an understanding of the instructions.   The patient was advised to call back or seek an in-person evaluation if the symptoms worsen or if the condition fails to improve as anticipated.  I provided 20 minutes of non-face-to-face time during this encounter.   Kallie Locks, FNP

## 2020-12-05 ENCOUNTER — Encounter: Payer: Self-pay | Admitting: Family Medicine

## 2021-05-30 ENCOUNTER — Ambulatory Visit: Payer: 59 | Admitting: Family Medicine

## 2021-05-31 ENCOUNTER — Ambulatory Visit (INDEPENDENT_AMBULATORY_CARE_PROVIDER_SITE_OTHER): Payer: 59 | Admitting: Nurse Practitioner

## 2021-05-31 ENCOUNTER — Encounter: Payer: Self-pay | Admitting: Nurse Practitioner

## 2021-05-31 ENCOUNTER — Other Ambulatory Visit: Payer: Self-pay

## 2021-05-31 VITALS — BP 134/76 | HR 76 | Temp 98.1°F | Ht 62.0 in | Wt 164.0 lb

## 2021-05-31 DIAGNOSIS — I10 Essential (primary) hypertension: Secondary | ICD-10-CM

## 2021-05-31 DIAGNOSIS — Z1231 Encounter for screening mammogram for malignant neoplasm of breast: Secondary | ICD-10-CM

## 2021-05-31 DIAGNOSIS — J302 Other seasonal allergic rhinitis: Secondary | ICD-10-CM | POA: Diagnosis not present

## 2021-05-31 DIAGNOSIS — M160 Bilateral primary osteoarthritis of hip: Secondary | ICD-10-CM

## 2021-05-31 DIAGNOSIS — Z131 Encounter for screening for diabetes mellitus: Secondary | ICD-10-CM

## 2021-05-31 DIAGNOSIS — M545 Low back pain, unspecified: Secondary | ICD-10-CM

## 2021-05-31 DIAGNOSIS — E785 Hyperlipidemia, unspecified: Secondary | ICD-10-CM

## 2021-05-31 DIAGNOSIS — G8929 Other chronic pain: Secondary | ICD-10-CM

## 2021-05-31 LAB — POCT GLYCOSYLATED HEMOGLOBIN (HGB A1C): Hemoglobin A1C: 5.7 % — AB (ref 4.0–5.6)

## 2021-05-31 LAB — POCT URINALYSIS DIPSTICK
Bilirubin, UA: NEGATIVE
Blood, UA: NEGATIVE
Glucose, UA: NEGATIVE
Ketones, UA: NEGATIVE
Leukocytes, UA: NEGATIVE
Nitrite, UA: NEGATIVE
Protein, UA: NEGATIVE
Spec Grav, UA: >=1.03 — AB (ref 1.010–1.025)
Urobilinogen, UA: 0.2 E.U./dL
pH, UA: 5.5 (ref 5.0–8.0)

## 2021-05-31 MED ORDER — CETIRIZINE HCL 10 MG PO TABS: 10.0000 mg | ORAL_TABLET | Freq: Every day | ORAL | 3 refills | Status: DC

## 2021-05-31 MED ORDER — AMLODIPINE BESYLATE 10 MG PO TABS: 10.0000 mg | ORAL_TABLET | Freq: Every day | ORAL | 3 refills | Status: DC

## 2021-05-31 MED ORDER — ATORVASTATIN CALCIUM 40 MG PO TABS: 40.0000 mg | ORAL_TABLET | Freq: Every day | ORAL | 3 refills | Status: DC

## 2021-05-31 NOTE — Progress Notes (Signed)
[facility] Patient [facility] [address] [place], [place]  [address] Phone:  [phone]   Fax:  [phone]   Established Patient Office Visit  Subjective:  Patient ID: Sharon Christensen, female    DOB: Jan 21, 1961  Age: 60 y.o. MRN: [id number]  CC:  Chief Complaint  Patient presents with   Follow-up    Yearly check up, no questions or concerns.     HPI Amaka Gluth presents for follow up. A former patient of NP Stroud.  has a past medical history of Bilateral lower extremity edema (11/2019), History of mycoplasma pneumonia (10/2019), Hyperlipidemia, Hypertension, Vitamin D deficiency (11/2019), and Wheezes (05/2020).   She is in today for follow-up.  She has hypertension for which she is currently taking amlodipine 10 mg daily.  She denies any side effects.  She is not do routine home monitoring.  She does work greater than 40 hours/week. She is also taking atorvastatin 40 mg for her hyperlipidemia.  She reports that she tolerates this well. Denies headache, dizziness, visual changes, shortness of breath, dyspnea on exertion, chest pain, nausea, vomiting or any edema.  She does have occasional bilateral hip pain.  This is noted more at night.  She also has some right lower back pain with occasional radiation down to her foot.  This is noted with pressing the brake pedal.  She reports a history of sciatica.  She denies any recent injuries or falls.  She does live alone.  She denies any numbness tingling or weakness.  Past Medical History:  Diagnosis Date   Bilateral lower extremity edema 11/2019   History of mycoplasma pneumonia 10/2019   Hyperlipidemia    Hypertension    Vitamin D deficiency 11/2019   Wheezes 05/2020    History reviewed. No pertinent surgical history.  Family History  Problem Relation Age of Onset   Diabetes Mother    Cancer Father    Diabetes Maternal Aunt     Social History   Socioeconomic History   Marital status: Single    Spouse name: Not on  file   Number of children: Not on file   Years of education: Not on file   Highest education level: Not on file  Occupational History   Not on file  Tobacco Use   Smoking status: Every Day    Packs/day: 0.50    Types: Cigarettes   Smokeless tobacco: Never  Vaping Use   Vaping Use: Never used  Substance and Sexual Activity   Alcohol use: Yes    Alcohol/week: 2.0 standard drinks    Types: 2 Cans of beer per week   Drug use: No   Sexual activity: Not Currently  Other Topics Concern   Not on file  Social History Narrative   Not on file   Social Determinants of Health   Financial Resource Strain: Not on file  Food Insecurity: Not on file  Transportation Needs: Not on file  Physical Activity: Not on file  Stress: Not on file  Social Connections: Not on file  Intimate Partner Violence: Not on file    Outpatient Medications Prior to Visit  Medication Sig Dispense Refill   albuterol (VENTOLIN HFA) 108 (90 Base) MCG/ACT inhaler Inhale 2 puffs into the lungs every 6 (six) hours as needed for wheezing or shortness of breath. 18 g 11   diphenhydrAMINE (BENADRYL ALLERGY) 25 mg capsule Take 1 capsule (25 mg total) by mouth every 6 (six) hours as needed. 30 capsule 1   Omega-3  Fatty Acids (FISH OIL) 1000 MG CAPS Take 3 capsules (3,000 mg total) by mouth daily. 90 capsule 2   amLODipine (NORVASC) 10 MG tablet Take 1 tablet (10 mg total) by mouth daily. 90 tablet 3   atorvastatin (LIPITOR) 40 MG tablet Take 1 tablet (40 mg total) by mouth daily. 90 tablet 3   cetirizine (ZYRTEC) 10 MG tablet Take 1 tablet (10 mg total) by mouth daily. 30 tablet 11   azithromycin (ZITHROMAX) 250 MG tablet Take 2 tablets (500 mg) by mouth, now. Then take 1 tablet (250 mg) by mouth, daily X 4 days until complete. 6 tablet 0   benzonatate (TESSALON) 100 MG capsule Take 1 capsule (100 mg total) by mouth 2 (two) times daily as needed for cough. 20 capsule 0   cyclobenzaprine (FLEXERIL) 10 MG tablet Take 1 tablet  (10 mg total) by mouth 3 (three) times daily as needed for muscle spasms. (Patient not taking: Reported on 05/31/2021) 30 tablet 2   furosemide (LASIX) 20 MG tablet Take 1 tablet (20 mg total) by mouth daily. As needed for lower extremity edema (Patient not taking: Reported on 05/31/2021) 90 tablet 3   ipratropium (ATROVENT) 0.03 % nasal spray Place 2 sprays into both nostrils 2 (two) times daily. 30 mL 11   Vitamin D, Ergocalciferol, (DRISDOL) 1.25 MG (50000 UNIT) CAPS capsule Take 1 capsule (50,000 Units total) by mouth every 7 (seven) days. 5 capsule 6   No facility-administered medications prior to visit.    No Known Allergies  ROS Review of Systems    Objective:    Physical Exam Constitutional:      General: She is not in acute distress. HENT:     Head: Normocephalic and atraumatic.     Nose: Nose normal.  Cardiovascular:     Rate and Rhythm: Normal rate and regular rhythm.     Pulses: Normal pulses.     Heart sounds: Normal heart sounds.  Pulmonary:     Effort: Pulmonary effort is normal.     Breath sounds: Normal breath sounds.  Abdominal:     General: Bowel sounds are normal.     Palpations: Abdomen is soft.  Musculoskeletal:        General: Normal range of motion.     Cervical back: Normal range of motion.  Skin:    General: Skin is warm.     Capillary Refill: Capillary refill takes less than 2 seconds.  Neurological:     General: No focal deficit present.     Mental Status: She is alert.  Psychiatric:        Mood and Affect: Mood normal.        Behavior: Behavior normal.        Thought Content: Thought content normal.        Judgment: Judgment normal.    BP 134/76 (BP Location: Left Arm, Patient Position: Sitting)   Pulse 76   Temp 98.1 F (36.7 C)   Ht 5\' 2"  (1.575 m)   Wt 164 lb 0.6 oz (74.4 kg)   SpO2 97%   BMI 30.00 kg/m  Wt Readings from Last 3 Encounters:  05/31/21 164 lb 0.6 oz (74.4 kg)  05/30/20 153 lb 9.6 oz (69.7 kg)  02/24/20 159 lb 12.8  oz (72.5 kg)     Health Maintenance Due  Topic Date Due   MAMMOGRAM  Never done    There are no preventive care reminders to display for this patient.  Lab Results  Component Value Date   TSH 1.620 12/01/2019   Lab Results  Component Value Date   WBC 7.1 12/01/2019   HGB 14.3 12/01/2019   HCT 40.7 12/01/2019   MCV 93 12/01/2019   PLT 385 12/01/2019   Lab Results  Component Value Date   NA 139 12/01/2019   K 4.2 12/01/2019   CO2 22 12/01/2019   GLUCOSE 101 (H) 12/01/2019   BUN 16 12/01/2019   CREATININE 0.63 12/01/2019   BILITOT 0.4 12/01/2019   ALKPHOS 115 12/01/2019   AST 25 12/01/2019   ALT 31 12/01/2019   PROT 7.4 12/01/2019   ALBUMIN 5.1 (H) 12/01/2019   CALCIUM 10.1 12/01/2019   Lab Results  Component Value Date   CHOL 214 (H) 12/01/2019   Lab Results  Component Value Date   HDL 84 12/01/2019   Lab Results  Component Value Date   LDLCALC 107 (H) 12/01/2019   Lab Results  Component Value Date   TRIG 134 12/01/2019   Lab Results  Component Value Date   CHOLHDL 2.5 12/01/2019   Lab Results  Component Value Date   HGBA1C 5.7 (A) 05/31/2021      Assessment & Plan:   Problem List Items Addressed This Visit       Cardiovascular and Mediastinum   Essential hypertension - Primary Stable we will continue with current regimen Encouraged on going compliance with current medication regimen Encouraged home monitoring and recording BP <130/80 Eating a heart-healthy diet with less salt Encouraged regular physical activity  Recommend Weight loss     Relevant Medications   amLODipine (NORVASC) 10 MG tablet   atorvastatin (LIPITOR) 40 MG tablet   Other Relevant Orders   Urinalysis Dipstick (Completed)   Comp. Metabolic Panel (12)     Other   Seasonal allergies Stabl Continue with current regimen.  No changes warranted. Good patient compliance.    Relevant Medications   cetirizine (ZYRTEC) 10 MG tablet   Hyperlipidemia Stable labs  pending Continue to encourage heart healthy diet   Relevant Medications   amLODipine (NORVASC) 10 MG tablet   atorvastatin (LIPITOR) 40 MG tablet   Other Relevant Orders   Lipid panel   Other Visit Diagnoses     Screening for diabetes mellitus       Relevant Orders   HgB A1c (Completed)   Encounter for screening mammogram for malignant neoplasm of breast       Relevant Orders   MM Digital Screening   Osteoarthritis of both hips, unspecified osteoarthritis type     Persistent however stable Exercises provided If symptoms persist or get worse patient to notify office for x-rays   Chronic right-sided low back pain without sciatica     Persistent however stable Exercises provided If symptoms persist or get worse and patient would like further evaluation x-rays to be ordered       Meds ordered this encounter  Medications   amLODipine (NORVASC) 10 MG tablet    Sig: Take 1 tablet (10 mg total) by mouth daily.    Dispense:  90 tablet    Refill:  3    Order Specific Question:   Supervising Provider    Answer:   Quentin Angst [[id number]]   atorvastatin (LIPITOR) 40 MG tablet    Sig: Take 1 tablet (40 mg total) by mouth daily.    Dispense:  90 tablet    Refill:  3    Order Specific Question:   Supervising Provider    Answer:  Quentin Angst [[id number]]   cetirizine (ZYRTEC) 10 MG tablet    Sig: Take 1 tablet (10 mg total) by mouth daily.    Dispense:  90 tablet    Refill:  3    Order Specific Question:   Supervising Provider    Answer:   Quentin Angst [id number]    Follow-up: Return in about 1 year (around 05/31/2022) for Follow up HTN [id number].    Barbette Merino, NP

## 2021-05-31 NOTE — Patient Instructions (Signed)
Health Maintenance, Female Adopting a healthy lifestyle and getting preventive care are important in promoting health and wellness. Ask your health care provider about: The right schedule for you to have regular tests and exams. Things you can do on your own to prevent diseases and keep yourself healthy. What should I know about diet, weight, and exercise? Eat a healthy diet  Eat a diet that includes plenty of vegetables, fruits, low-fat dairy products, and lean protein. Do not eat a lot of foods that are high in solid fats, added sugars, or sodium.  Maintain a healthy weight Body mass index (BMI) is used to identify weight problems. It estimates body fat based on height and weight. Your health care provider can help determineyour BMI and help you achieve or maintain a healthy weight. Get regular exercise Get regular exercise. This is one of the most important things you can do for your health. Most adults should: Exercise for at least 150 minutes each week. The exercise should increase your heart rate and make you sweat (moderate-intensity exercise). Do strengthening exercises at least twice a week. This is in addition to the moderate-intensity exercise. Spend less time sitting. Even light physical activity can be beneficial. Watch cholesterol and blood lipids Have your blood tested for lipids and cholesterol at 60 years of age, then havethis test every 5 years. Have your cholesterol levels checked more often if: Your lipid or cholesterol levels are high. You are older than 60 years of age. You are at high risk for heart disease. What should I know about cancer screening? Depending on your health history and family history, you may need to have cancer screening at various ages. This may include screening for: Breast cancer. Cervical cancer. Colorectal cancer. Skin cancer. Lung cancer. What should I know about heart disease, diabetes, and high blood pressure? Blood pressure and heart  disease High blood pressure causes heart disease and increases the risk of stroke. This is more likely to develop in people who have high blood pressure readings, are of African descent, or are overweight. Have your blood pressure checked: Every 3-5 years if you are 18-39 years of age. Every year if you are 40 years old or older. Diabetes Have regular diabetes screenings. This checks your fasting blood sugar level. Have the screening done: Once every three years after age 40 if you are at a normal weight and have a low risk for diabetes. More often and at a younger age if you are overweight or have a high risk for diabetes. What should I know about preventing infection? Hepatitis B If you have a higher risk for hepatitis B, you should be screened for this virus. Talk with your health care provider to find out if you are at risk forhepatitis B infection. Hepatitis C Testing is recommended for: Everyone born from 1945 through 1965. Anyone with known risk factors for hepatitis C. Sexually transmitted infections (STIs) Get screened for STIs, including gonorrhea and chlamydia, if: You are sexually active and are younger than 60 years of age. You are older than 60 years of age and your health care provider tells you that you are at risk for this type of infection. Your sexual activity has changed since you were last screened, and you are at increased risk for chlamydia or gonorrhea. Ask your health care provider if you are at risk. Ask your health care provider about whether you are at high risk for HIV. Your health care provider may recommend a prescription medicine to help   prevent HIV infection. If you choose to take medicine to prevent HIV, you should first get tested for HIV. You should then be tested every 3 months for as long as you are taking the medicine. Pregnancy If you are about to stop having your period (premenopausal) and you may become pregnant, seek counseling before you get  pregnant. Take 400 to 800 micrograms (mcg) of folic acid every day if you become pregnant. Ask for birth control (contraception) if you want to prevent pregnancy. Osteoporosis and menopause Osteoporosis is a disease in which the bones lose minerals and strength with aging. This can result in bone fractures. If you are 63 years old or older, or if you are at risk for osteoporosis and fractures, ask your health care provider if you should: Be screened for bone loss. Take a calcium or vitamin D supplement to lower your risk of fractures. Be given hormone replacement therapy (HRT) to treat symptoms of menopause. Follow these instructions at home: Lifestyle Do not use any products that contain nicotine or tobacco, such as cigarettes, e-cigarettes, and chewing tobacco. If you need help quitting, ask your health care provider. Do not use street drugs. Do not share needles. Ask your health care provider for help if you need support or information about quitting drugs. Alcohol use Do not drink alcohol if: Your health care provider tells you not to drink. You are pregnant, may be pregnant, or are planning to become pregnant. If you drink alcohol: Limit how much you use to 0-1 drink a day. Limit intake if you are breastfeeding. Be aware of how much alcohol is in your drink. In the [place] one drink equals one 12 oz bottle of beer (355 mL), one 5 oz glass of wine (148 mL), or one 1 oz glass of hard liquor (44 mL). General instructions Schedule regular health, dental, and eye exams. Stay current with your vaccines. Tell your health care provider if: You often feel depressed. You have ever been abused or do not feel safe at home. Summary Adopting a healthy lifestyle and getting preventive care are important in promoting health and wellness. Follow your health care provider's instructions about healthy diet, exercising, and getting tested or screened for diseases. Follow your health care provider's  instructions on monitoring your cholesterol and blood pressure. This information is not intended to replace advice given to you by your health care provider. Make sure you discuss any questions you have with your healthcare provider. Document Revised: 10/22/2018 Document Reviewed: 10/22/2018 Elsevier Patient Education  2022 [employer] Low Back Sprain or Strain Rehab Ask your health care provider which exercises are safe for you. Do exercises exactly as told by your health care provider and adjust them as directed. It is normal to feel mild stretching, pulling, tightness, or discomfort as you do these exercises. Stop right away if you feel sudden pain or your pain gets worse. Do not begin these exercises until told by your health care provider. Stretching and range-of-motion exercises These exercises warm up your muscles and joints and improve the movement and flexibility of your back. These exercises also help to relieve pain, numbness,and tingling. Lumbar rotation  Lie on your back on a firm surface and bend your knees. Straighten your arms out to your sides so each arm forms a 90-degree angle (right angle) with a side of your body. Slowly move (rotate) both of your knees to one side of your body until you feel a stretch in your lower back (lumbar). Try not  to let your shoulders lift off the floor. Hold this position for __________ seconds. Tense your abdominal muscles and slowly move your knees back to the starting position. Repeat this exercise on the other side of your body. Repeat __________ times. Complete this exercise __________ times a day. Single knee to chest  Lie on your back on a firm surface with both legs straight. Bend one of your knees. Use your hands to move your knee up toward your chest until you feel a gentle stretch in your lower back and buttock. Hold your leg in this position by holding on to the front of your knee. Keep your other leg as straight as possible. Hold  this position for __________ seconds. Slowly return to the starting position. Repeat with your other leg. Repeat __________ times. Complete this exercise __________ times a day. Prone extension on elbows  Lie on your abdomen on a firm surface (prone position). Prop yourself up on your elbows. Use your arms to help lift your chest up until you feel a gentle stretch in your abdomen and your lower back. This will place some of your body weight on your elbows. If this is uncomfortable, try stacking pillows under your chest. Your hips should stay down, against the surface that you are lying on. Keep your hip and back muscles relaxed. Hold this position for __________ seconds. Slowly relax your upper body and return to the starting position. Repeat __________ times. Complete this exercise __________ times a day. Strengthening exercises These exercises build strength and endurance in your back. Endurance is theability to use your muscles for a long time, even after they get tired. Pelvic tilt This exercise strengthens the muscles that lie deep in the abdomen. Lie on your back on a firm surface. Bend your knees and keep your feet flat on the floor. Tense your abdominal muscles. Tip your pelvis up toward the ceiling and flatten your lower back into the floor. To help with this exercise, you may place a small towel under your lower back and try to push your back into the towel. Hold this position for __________ seconds. Let your muscles relax completely before you repeat this exercise. Repeat __________ times. Complete this exercise __________ times a day. Alternating arm and leg raises  Get on your hands and knees on a firm surface. If you are on a hard floor, you may want to use padding, such as an exercise mat, to cushion your knees. Line up your arms and legs. Your hands should be directly below your shoulders, and your knees should be directly below your hips. Lift your left leg behind you. At  the same time, raise your right arm and straighten it in front of you. Do not lift your leg higher than your hip. Do not lift your arm higher than your shoulder. Keep your abdominal and back muscles tight. Keep your hips facing the ground. Do not arch your back. Keep your balance carefully, and do not hold your breath. Hold this position for __________ seconds. Slowly return to the starting position. Repeat with your right leg and your left arm. Repeat __________ times. Complete this exercise __________ times a day. Abdominal set with straight leg raise  Lie on your back on a firm surface. Bend one of your knees and keep your other leg straight. Tense your abdominal muscles and lift your straight leg up, 4-6 inches (10-15 cm) off the ground. Keep your abdominal muscles tight and hold this position for __________ seconds. Do not  hold your breath. Do not arch your back. Keep it flat against the ground. Keep your abdominal muscles tense as you slowly lower your leg back to the starting position. Repeat with your other leg. Repeat __________ times. Complete this exercise __________ times a day. Single leg lower with bent knees Lie on your back on a firm surface. Tense your abdominal muscles and lift your feet off the floor, one foot at a time, so your knees and hips are bent in 90-degree angles (right angles). Your knees should be over your hips and your lower legs should be parallel to the floor. Keeping your abdominal muscles tense and your knee bent, slowly lower one of your legs so your toe touches the ground. Lift your leg back up to return to the starting position. Do not hold your breath. Do not let your back arch. Keep your back flat against the ground. Repeat with your other leg. Repeat __________ times. Complete this exercise __________ times a day. Posture and body mechanics Good posture and healthy body mechanics can help to relieve stress in your body's tissues and joints.  Body mechanics refers to the movements and positions of your body while you do your daily activities. Posture is part of body mechanics. Good posture means: Your spine is in its natural S-curve position (neutral). Your shoulders are pulled back slightly. Your head is not tipped forward. Follow these guidelines to improve your posture and body mechanics in youreveryday activities. Standing  When standing, keep your spine neutral and your feet about hip width apart. Keep a slight bend in your knees. Your ears, shoulders, and hips should line up. When you do a task in which you stand in one place for a long time, place one foot up on a stable object that is 2-4 inches (5-10 cm) high, such as a footstool. This helps keep your spine neutral.  Sitting  When sitting, keep your spine neutral and keep your feet flat on the floor. Use a footrest, if necessary, and keep your thighs parallel to the floor. Avoid rounding your shoulders, and avoid tilting your head forward. When working at a desk or a computer, keep your desk at a height where your hands are slightly lower than your elbows. Slide your chair under your desk so you are close enough to maintain good posture. When working at a computer, place your monitor at a height where you are looking straight ahead and you do not have to tilt your head forward or downward to look at the screen.  Resting When lying down and resting, avoid positions that are most painful for you. If you have pain with activities such as sitting, bending, stooping, or squatting, lie in a position in which your body does not bend very much. For example, avoid curling up on your side with your arms and knees near your chest (fetal position). If you have pain with activities such as standing for a long time or reaching with your arms, lie with your spine in a neutral position and bend your knees slightly. Try the following positions: Lying on your side with a pillow between your  knees. Lying on your back with a pillow under your knees. Lifting  When lifting objects, keep your feet at least shoulder width apart and tighten your abdominal muscles. Bend your knees and hips and keep your spine neutral. It is important to lift using the strength of your legs, not your back. Do not lock your knees straight out. Always ask  for help to lift heavy or awkward objects.  This information is not intended to replace advice given to you by your health care provider. Make sure you discuss any questions you have with your healthcare provider. Document Revised: 02/20/2019 Document Reviewed: 11/20/2018 Elsevier Patient Education  2022 [employer].

## 2021-06-01 LAB — COMP. METABOLIC PANEL (12)
AST: 21 IU/L (ref 0–40)
Albumin/Globulin Ratio: 2.2 (ref 1.2–2.2)
Albumin: 4.7 g/dL (ref 3.8–4.9)
Alkaline Phosphatase: 106 IU/L (ref 44–121)
BUN/Creatinine Ratio: 17 (ref 12–28)
BUN: 12 mg/dL (ref 8–27)
Bilirubin Total: 0.4 mg/dL (ref 0.0–1.2)
Calcium: 8.9 mg/dL (ref 8.7–10.3)
Chloride: 104 mmol/L (ref 96–106)
Creatinine, Ser: 0.72 mg/dL (ref 0.57–1.00)
Globulin, Total: 2.1 g/dL (ref 1.5–4.5)
Glucose: 95 mg/dL (ref 65–99)
Potassium: 4.7 mmol/L (ref 3.5–5.2)
Sodium: 142 mmol/L (ref 134–144)
Total Protein: 6.8 g/dL (ref 6.0–8.5)
eGFR: 96 mL/min/{1.73_m2} (ref >59)

## 2021-06-01 LAB — LIPID PANEL
Chol/HDL Ratio: 2.8 ratio (ref 0.0–4.4)
Cholesterol, Total: 168 mg/dL (ref 100–199)
HDL: 60 mg/dL (ref >39)
LDL Chol Calc (NIH): 88 mg/dL (ref 0–99)
Triglycerides: 113 mg/dL (ref 0–149)
VLDL Cholesterol Cal: 20 mg/dL (ref 5–40)

## 2021-07-11 ENCOUNTER — Telehealth: Payer: Self-pay

## 2021-07-11 NOTE — Telephone Encounter (Signed)
Ear antibiotics

## 2021-07-12 NOTE — Telephone Encounter (Signed)
Spoke with patient and advised she go to an urgent care because [employer] [facility] is out of the office until next week and has no available appointments.

## 2021-09-15 ENCOUNTER — Ambulatory Visit: Payer: 59

## 2021-09-18 ENCOUNTER — Other Ambulatory Visit: Payer: Self-pay

## 2021-09-18 ENCOUNTER — Emergency Department (HOSPITAL_BASED_OUTPATIENT_CLINIC_OR_DEPARTMENT_OTHER)
Admission: EM | Admit: 2021-09-18 | Discharge: 2021-09-18 | Disposition: A | Discharge status: Home / Self Care | Payer: 59 | Attending: Emergency Medicine | Admitting: Emergency Medicine

## 2021-09-18 ENCOUNTER — Ambulatory Visit: Payer: 59 | Admitting: Nurse Practitioner

## 2021-09-18 ENCOUNTER — Encounter (HOSPITAL_BASED_OUTPATIENT_CLINIC_OR_DEPARTMENT_OTHER): Payer: Self-pay

## 2021-09-18 DIAGNOSIS — H9202 Otalgia, left ear: Secondary | ICD-10-CM | POA: Diagnosis present

## 2021-09-18 DIAGNOSIS — I10 Essential (primary) hypertension: Secondary | ICD-10-CM | POA: Insufficient documentation

## 2021-09-18 DIAGNOSIS — F17210 Nicotine dependence, cigarettes, uncomplicated: Secondary | ICD-10-CM | POA: Insufficient documentation

## 2021-09-18 DIAGNOSIS — Z79899 Other long term (current) drug therapy: Secondary | ICD-10-CM | POA: Diagnosis not present

## 2021-09-18 DIAGNOSIS — H6062 Unspecified chronic otitis externa, left ear: Secondary | ICD-10-CM | POA: Insufficient documentation

## 2021-09-18 MED ORDER — PREDNISONE 20 MG PO TABS: 40.0000 mg | ORAL_TABLET | Freq: Every day | ORAL | 0 refills | Status: AC

## 2021-09-18 NOTE — ED Provider Notes (Signed)
[facility] EMERGENCY DEPARTMENT Provider Note   CSN: [id number] Arrival date & time: 09/18/21  0935     History Chief Complaint  Patient presents with   Otalgia    Sharon Christensen is a 60 y.o. female.  HPI 60 year old female presents with left ear pain.  She states this has been ongoing for at least 3 months.  Back at around Labor Day she received antibiotic eardrops that she states did seem to help.  Previous to that she was given oral antibiotics.  She has been feeling some swelling in her ear and decreased ability to hear.  Sometimes it hurts to eat.  No real jaw pain.  No trouble breathing or headache.  She has had this before and states that oral steroids helped and she is hoping to get that.  She states over the last 2 weeks the pain seems to be slowly worsening.  Past Medical History:  Diagnosis Date   Bilateral lower extremity edema 11/2019   History of mycoplasma pneumonia 10/2019   Hyperlipidemia    Hypertension    Vitamin D deficiency 11/2019   Wheezes 05/2020    Patient Active Problem List   Diagnosis Date Noted   Wheezes 06/01/2020   Acute allergic otitis media of left ear 02/25/2020   Ear pain, left 02/25/2020   Vitamin D deficiency 02/25/2020   Hyperlipidemia 12/02/2019   Bilateral lower extremity edema 12/02/2019   Essential hypertension 03/26/2019   Shortness of breath 03/26/2019   Seasonal allergies 03/26/2019    History reviewed. No pertinent surgical history.   OB History     Gravida  0   Para  0   Term  0   Preterm  0   AB  0   Living  0      SAB  0   IAB  0   Ectopic  0   Multiple  0   Live Births  0           Family History  Problem Relation Age of Onset   Diabetes Mother    Cancer Father    Diabetes Maternal Aunt     Social History   Tobacco Use   Smoking status: Every Day    Packs/day: 0.50    Types: Cigarettes   Smokeless tobacco: Never  Vaping Use   Vaping Use: Never used  Substance Use  Topics   Alcohol use: Yes    Alcohol/week: 2.0 standard drinks    Types: 2 Cans of beer per week   Drug use: No    Home Medications Prior to Admission medications   Medication Sig Start Date End Date Taking? Authorizing Provider  predniSONE (DELTASONE) 20 MG tablet Take 2 tablets (40 mg total) by mouth daily for 5 days. 09/18/21 09/23/21 Yes Pricilla Loveless, MD  albuterol (VENTOLIN HFA) 108 (90 Base) MCG/ACT inhaler Inhale 2 puffs into the lungs every 6 (six) hours as needed for wheezing or shortness of breath. 05/30/20   Kallie Locks, FNP  amLODipine (NORVASC) 10 MG tablet Take 1 tablet (10 mg total) by mouth daily. 05/31/21   Barbette Merino, NP  atorvastatin (LIPITOR) 40 MG tablet Take 1 tablet (40 mg total) by mouth daily. 05/31/21   Barbette Merino, NP  cetirizine (ZYRTEC) 10 MG tablet Take 1 tablet (10 mg total) by mouth daily. 05/31/21 05/31/22  Barbette Merino, NP  diphenhydrAMINE (BENADRYL ALLERGY) 25 mg capsule Take 1 capsule (25 mg total) by mouth every 6 (six)  hours as needed. 05/19/19   Kallie Locks, FNP  Omega-3 Fatty Acids (FISH OIL) 1000 MG CAPS Take 3 capsules (3,000 mg total) by mouth daily. 09/24/18   Kallie Locks, FNP    Allergies    Patient has no known allergies.  Review of Systems   Review of Systems  Constitutional:  Negative for fever.  HENT:  Positive for ear pain. Negative for facial swelling.   Respiratory:  Negative for shortness of breath.   Neurological:  Negative for headaches.   Physical Exam Updated Vital Signs BP (!) 144/90   Pulse 80   Temp 98 F (36.7 C) (Oral)   Resp 18   Ht 5\' 2"  (1.575 m)   Wt 73.9 kg   SpO2 98%   BMI 29.81 kg/m   Physical Exam Vitals and nursing note reviewed.  Constitutional:      Appearance: She is well-developed.  HENT:     Head: Normocephalic and atraumatic.     Right Ear: Tympanic membrane, ear canal and external ear normal.     Left Ear: External ear normal. No mastoid tenderness.     Ears:      Comments: There is no mastoid tenderness or erythema on the left side.  I cannot visualize the TM and there is some swelling in the ear canal near the TM.  There is what appears to be some debris as well.  No significant tenderness with moving her left external ear    Nose: Nose normal.  Eyes:     General:        Right eye: No discharge.        Left eye: No discharge.  Cardiovascular:     Rate and Rhythm: Normal rate and regular rhythm.     Heart sounds: Normal heart sounds.  Pulmonary:     Effort: Pulmonary effort is normal.     Breath sounds: Normal breath sounds.  Abdominal:     General: There is no distension.  Musculoskeletal:     Cervical back: Neck supple.  Skin:    General: Skin is warm and dry.  Neurological:     Mental Status: She is alert.  Psychiatric:        Mood and Affect: Mood is not anxious.    ED Results / Procedures / Treatments   Labs (all labs ordered are listed, but only abnormal results are displayed) Labs Reviewed - No data to display  EKG None  Radiology No results found.  Procedures Procedures   Medications Ordered in ED Medications - No data to display  ED Course  I have reviewed the triage vital signs and the nursing notes.  Pertinent labs & imaging results that were available during my care of the patient were reviewed by me and considered in my medical decision making (see chart for details).    MDM Rules/Calculators/A&P                           Patient's left ear canal is definitely abnormal with what appears to be some debris near the TM.  I cannot fully visualize the TM.  Seems like this is a chronic process and due to this I am not really sure its infectious at this point.  Seem like she has had some ear difficulties for a while based on chart review.  Otherwise, I think the most important thing will be having her to see an ear nose throat specialist  and potentially get some debridement.  She is really wanting some steroids as this  has helped in the past.  I do not think this is unreasonable although discussed it would be better to have topical drops which I have offered to prescribe but she does not want.  I am hesitant to put her on a fluoroquinolone in addition to oral steroids given known side effects when put together, especially with questionable acuteness/infectious presentation.  There is no mastoid tenderness, redness, systemic symptoms.  I do not think she has a complicated infection needing IV antibiotics or CT.  Given return precautions. Final Clinical Impression(s) / ED Diagnoses Final diagnoses:  Chronic otitis externa of left ear, unspecified type    Rx / DC Orders ED Discharge Orders          Ordered    predniSONE (DELTASONE) 20 MG tablet  Daily        09/18/21 1054             Pricilla Loveless, MD 09/18/21 1103

## 2021-09-18 NOTE — ED Triage Notes (Signed)
Pt c/o left ear pain x 3 months. States has been to doctors/UC 3 times. Dx with swimmers ear. Given ear drops. No improvement.

## 2021-09-18 NOTE — Discharge Instructions (Addendum)
If you develop fever, new or worsening pain, pain behind her ear or redness/swelling behind her ear, or any other new/concerning symptoms then return to the ER for evaluation.

## 2022-06-01 ENCOUNTER — Ambulatory Visit: Payer: 59 | Admitting: Nurse Practitioner

## 2022-06-01 ENCOUNTER — Ambulatory Visit: Payer: Self-pay | Admitting: Nurse Practitioner

## 2022-06-08 ENCOUNTER — Encounter: Payer: Self-pay | Admitting: Nurse Practitioner

## 2022-06-08 ENCOUNTER — Ambulatory Visit (INDEPENDENT_AMBULATORY_CARE_PROVIDER_SITE_OTHER): Payer: 59 | Admitting: Nurse Practitioner

## 2022-06-08 ENCOUNTER — Ambulatory Visit (HOSPITAL_COMMUNITY)
Admission: RE | Admit: 2022-06-08 | Discharge: 2022-06-08 | Disposition: A | Discharge status: Home / Self Care | Payer: 59 | Source: Ambulatory Visit | Attending: Nurse Practitioner | Admitting: Nurse Practitioner

## 2022-06-08 VITALS — BP 137/80 | HR 83 | Temp 97.6°F | Ht 62.0 in | Wt 165.2 lb

## 2022-06-08 DIAGNOSIS — G8929 Other chronic pain: Secondary | ICD-10-CM | POA: Diagnosis present

## 2022-06-08 DIAGNOSIS — Z0000 Encounter for general adult medical examination without abnormal findings: Secondary | ICD-10-CM

## 2022-06-08 DIAGNOSIS — M25551 Pain in right hip: Secondary | ICD-10-CM | POA: Diagnosis present

## 2022-06-08 DIAGNOSIS — I10 Essential (primary) hypertension: Secondary | ICD-10-CM | POA: Diagnosis not present

## 2022-06-08 DIAGNOSIS — E785 Hyperlipidemia, unspecified: Secondary | ICD-10-CM

## 2022-06-08 LAB — POCT URINALYSIS DIP (CLINITEK)
Bilirubin, UA: NEGATIVE
Blood, UA: NEGATIVE
Glucose, UA: NEGATIVE mg/dL
Leukocytes, UA: NEGATIVE
Nitrite, UA: NEGATIVE
Spec Grav, UA: >=1.03 — AB (ref 1.010–1.025)
Urobilinogen, UA: 0.2 E.U./dL
pH, UA: 5.5 (ref 5.0–8.0)

## 2022-06-08 LAB — POCT GLYCOSYLATED HEMOGLOBIN (HGB A1C)
HbA1c POC (<> result, manual entry): 5.8 % (ref 4.0–5.6)
HbA1c, POC (controlled diabetic range): 5.8 % (ref 0.0–7.0)
HbA1c, POC (prediabetic range): 5.8 % (ref 5.7–6.4)
Hemoglobin A1C: 5.8 % — AB (ref 4.0–5.6)

## 2022-06-08 MED ORDER — MELOXICAM 7.5 MG PO TABS: 7.5000 mg | ORAL_TABLET | Freq: Every day | ORAL | 0 refills | Status: AC

## 2022-06-08 MED ORDER — ATORVASTATIN CALCIUM 40 MG PO TABS: 40.0000 mg | ORAL_TABLET | Freq: Every day | ORAL | 3 refills | Status: DC

## 2022-06-08 MED ORDER — AMLODIPINE BESYLATE 10 MG PO TABS: 10.0000 mg | ORAL_TABLET | Freq: Every day | ORAL | 3 refills | Status: DC

## 2022-06-08 NOTE — Progress Notes (Signed)
@Patient  ID: , female    DOB: [birth year], 61 y.o.   MRN: [id number]  Chief Complaint  Patient presents with   Follow-up    Pt is here for 1 year follow up visit HTN. Pt states when on her feet for long she has swelling but the LT swells more than the other also has RT hip pain soreness when sleeping and it feels numbness pt has a spot in her head that she would like looked at     Referring provider: No ref. provider found   HPI  Sharon Christensen presents for follow up. A former patient of NP [place].  has a past medical history of Bilateral lower extremity edema (11/2019), History of mycoplasma pneumonia (10/2019), Hyperlipidemia, Hypertension, Vitamin D deficiency (11/2019), and Wheezes (05/2020).    Patient presents today for a follow-up on hypertension.  She states that she does notice swelling in her ankles at times.  She does work in a hot environment in a factory.  She does not wear compression hose.  She does try to stay well-hydrated.  She also complains of intermittent right hip pain.  We discussed that we will check an x-ray today.  This is most likely due to standing on her feet for long periods of time at work.  Patient does need refills on medications.  She does need blood work today. Denies f/c/s, n/v/d, hemoptysis, PND, leg swelling Denies chest pain or edema      No Known Allergies  Immunization History  Administered Date(s) Administered   PFIZER(Purple Top)SARS-COV-2 Vaccination 02/19/2020, 03/12/2020    Past Medical History:  Diagnosis Date   Bilateral lower extremity edema 11/2019   History of mycoplasma pneumonia 10/2019   Hyperlipidemia    Hypertension    Vitamin D deficiency 11/2019   Wheezes 05/2020    Tobacco History: Social History   Tobacco Use  Smoking Status Every Day   Packs/day: 0.50   Types: Cigarettes  Smokeless Tobacco Never   Ready to quit: Not Answered Counseling given: Not Answered   Outpatient Encounter Medications as  of 06/08/2022  Medication Sig   albuterol (VENTOLIN HFA) 108 (90 Base) MCG/ACT inhaler Inhale 2 puffs into the lungs every 6 (six) hours as needed for wheezing or shortness of breath.   meloxicam (MOBIC) 7.5 MG tablet Take 1 tablet (7.5 mg total) by mouth daily.   [DISCONTINUED] amLODipine (NORVASC) 10 MG tablet Take 1 tablet (10 mg total) by mouth daily.   [DISCONTINUED] atorvastatin (LIPITOR) 40 MG tablet Take 1 tablet (40 mg total) by mouth daily.   amLODipine (NORVASC) 10 MG tablet Take 1 tablet (10 mg total) by mouth daily.   atorvastatin (LIPITOR) 40 MG tablet Take 1 tablet (40 mg total) by mouth daily.   cetirizine (ZYRTEC) 10 MG tablet Take 1 tablet (10 mg total) by mouth daily.   diphenhydrAMINE (BENADRYL ALLERGY) 25 mg capsule Take 1 capsule (25 mg total) by mouth every 6 (six) hours as needed. (Patient not taking: Reported on 06/08/2022)   Omega-3 Fatty Acids (FISH OIL) 1000 MG CAPS Take 3 capsules (3,000 mg total) by mouth daily. (Patient not taking: Reported on 06/08/2022)   No facility-administered encounter medications on file as of 06/08/2022.     Review of Systems  Review of Systems  Constitutional: Negative.   HENT: Negative.    Cardiovascular: Negative.   Gastrointestinal: Negative.   Allergic/Immunologic: Negative.   Neurological: Negative.   Psychiatric/Behavioral: Negative.         Physical Exam  BP 137/80 (BP Location: Left Arm, Patient Position: Sitting, Cuff Size: Large)   Pulse 83   Temp 97.6 F (36.4 C)   Ht 5\' 2"  (1.575 m)   Wt 165 lb 3.2 oz (74.9 kg)   SpO2 100%   BMI 30.22 kg/m   Wt Readings from Last 5 Encounters:  06/08/22 165 lb 3.2 oz (74.9 kg)  09/18/21 163 lb (73.9 kg)  05/31/21 164 lb 0.6 oz (74.4 kg)  05/30/20 153 lb 9.6 oz (69.7 kg)  02/24/20 159 lb 12.8 oz (72.5 kg)     Physical Exam Vitals and nursing note reviewed.  Constitutional:      General: She is not in acute distress.    Appearance: She is well-developed.   Cardiovascular:     Rate and Rhythm: Normal rate and regular rhythm.  Pulmonary:     Effort: Pulmonary effort is normal.     Breath sounds: Normal breath sounds.  Neurological:     Mental Status: She is alert and oriented to person, place, and time.      Lab Results:  CBC    Component Value Date/Time   WBC 8.3 06/08/2022 1424   WBC 5.5 06/28/2017 1025   RBC 4.32 06/08/2022 1424   RBC 4.26 06/28/2017 1025   HGB 13.5 06/08/2022 1424   HCT 40.5 06/08/2022 1424   PLT 363 06/08/2022 1424   MCV 94 06/08/2022 1424   MCH 31.3 06/08/2022 1424   MCH 32.2 06/28/2017 1025   MCHC 33.3 06/08/2022 1424   MCHC 33.4 06/28/2017 1025   RDW 12.0 06/08/2022 1424   LYMPHSABS 1.7 12/01/2019 0943   MONOABS 495 06/28/2017 1025   EOSABS 0.1 12/01/2019 0943   BASOSABS 0.1 12/01/2019 0943    BMET    Component Value Date/Time   NA 141 06/08/2022 1424   K 4.2 06/08/2022 1424   CL 104 06/08/2022 1424   CO2 22 06/08/2022 1424   GLUCOSE 80 06/08/2022 1424   GLUCOSE 102 (H) 06/28/2017 1025   BUN 13 06/08/2022 1424   CREATININE 0.75 06/08/2022 1424   CREATININE 0.69 06/28/2017 1025   CALCIUM 9.4 06/08/2022 1424   GFRNONAA 98 12/01/2019 0943   GFRNONAA >89 06/28/2017 1025   GFRAA 114 12/01/2019 0943   GFRAA >89 06/28/2017 1025     Assessment & Plan:   Essential hypertension - POCT glycosylated hemoglobin (Hb A1C) - POCT URINALYSIS DIP (CLINITEK) - CBC - Comprehensive metabolic panel - Lipid Panel - Vitamin D, 25-hydroxy  2. Chronic right hip pain  - DG Hip Unilat W OR W/O Pelvis 2-3 Views Right - Vitamin D, 25-hydroxy  3. Hyperlipidemia, unspecified hyperlipidemia type  - atorvastatin (LIPITOR) 40 MG tablet; Take 1 tablet (40 mg total) by mouth daily.  Dispense: 90 tablet; Refill: 3  4. Essential hypertension  - amLODipine (NORVASC) 10 MG tablet; Take 1 tablet (10 mg total) by mouth daily.  Dispense: 90 tablet; Refill: 3   Follow up:  Follow up in 1 year      06/30/2017, NP 06/22/2022

## 2022-06-08 NOTE — Patient Instructions (Signed)
1. Health care maintenance  - POCT glycosylated hemoglobin (Hb A1C) - POCT URINALYSIS DIP (CLINITEK) - CBC - Comprehensive metabolic panel - Lipid Panel - Vitamin D, 25-hydroxy  2. Chronic right hip pain  - DG Hip Unilat W OR W/O Pelvis 2-3 Views Right - Vitamin D, 25-hydroxy  3. Hyperlipidemia, unspecified hyperlipidemia type  - atorvastatin (LIPITOR) 40 MG tablet; Take 1 tablet (40 mg total) by mouth daily.  Dispense: 90 tablet; Refill: 3  4. Essential hypertension  - amLODipine (NORVASC) 10 MG tablet; Take 1 tablet (10 mg total) by mouth daily.  Dispense: 90 tablet; Refill: 3   Follow up:  Follow up in 1 year

## 2022-06-08 NOTE — Assessment & Plan Note (Signed)
-   POCT glycosylated hemoglobin (Hb A1C) - POCT URINALYSIS DIP (CLINITEK) - CBC - Comprehensive metabolic panel - Lipid Panel - Vitamin D, 25-hydroxy  2. Chronic right hip pain  - DG Hip Unilat W OR W/O Pelvis 2-3 Views Right - Vitamin D, 25-hydroxy  3. Hyperlipidemia, unspecified hyperlipidemia type  - atorvastatin (LIPITOR) 40 MG tablet; Take 1 tablet (40 mg total) by mouth daily.  Dispense: 90 tablet; Refill: 3  4. Essential hypertension  - amLODipine (NORVASC) 10 MG tablet; Take 1 tablet (10 mg total) by mouth daily.  Dispense: 90 tablet; Refill: 3   Follow up:  Follow up in 1 year

## 2022-06-09 LAB — CBC
Hematocrit: 40.5 % (ref 34.0–46.6)
Hemoglobin: 13.5 g/dL (ref 11.1–15.9)
MCH: 31.3 pg (ref 26.6–33.0)
MCHC: 33.3 g/dL (ref 31.5–35.7)
MCV: 94 fL (ref 79–97)
Platelets: 363 10*3/uL (ref 150–450)
RBC: 4.32 x10E6/uL (ref 3.77–5.28)
RDW: 12 % (ref 11.7–15.4)
WBC: 8.3 10*3/uL (ref 3.4–10.8)

## 2022-06-09 LAB — COMPREHENSIVE METABOLIC PANEL
ALT: 26 IU/L (ref 0–32)
AST: 23 IU/L (ref 0–40)
Albumin/Globulin Ratio: 2.2 (ref 1.2–2.2)
Albumin: 4.6 g/dL (ref 3.9–4.9)
Alkaline Phosphatase: 88 IU/L (ref 44–121)
BUN/Creatinine Ratio: 17 (ref 12–28)
BUN: 13 mg/dL (ref 8–27)
Bilirubin Total: 0.5 mg/dL (ref 0.0–1.2)
CO2: 22 mmol/L (ref 20–29)
Calcium: 9.4 mg/dL (ref 8.7–10.3)
Chloride: 104 mmol/L (ref 96–106)
Creatinine, Ser: 0.75 mg/dL (ref 0.57–1.00)
Globulin, Total: 2.1 g/dL (ref 1.5–4.5)
Glucose: 80 mg/dL (ref 70–99)
Potassium: 4.2 mmol/L (ref 3.5–5.2)
Sodium: 141 mmol/L (ref 134–144)
Total Protein: 6.7 g/dL (ref 6.0–8.5)
eGFR: 91 mL/min/{1.73_m2} (ref >59)

## 2022-06-09 LAB — LIPID PANEL
Chol/HDL Ratio: 2.6 ratio (ref 0.0–4.4)
Cholesterol, Total: 151 mg/dL (ref 100–199)
HDL: 59 mg/dL (ref >39)
LDL Chol Calc (NIH): 71 mg/dL (ref 0–99)
Triglycerides: 117 mg/dL (ref 0–149)
VLDL Cholesterol Cal: 21 mg/dL (ref 5–40)

## 2022-06-09 LAB — VITAMIN D 25 HYDROXY (VIT D DEFICIENCY, FRACTURES): Vit D, 25-Hydroxy: 25.2 ng/mL — ABNORMAL LOW (ref 30.0–100.0)

## 2022-06-22 ENCOUNTER — Encounter: Payer: Self-pay | Admitting: Nurse Practitioner

## 2022-07-03 ENCOUNTER — Telehealth: Payer: Self-pay

## 2022-07-03 NOTE — Telephone Encounter (Signed)
Pt called in and had an episode on Sunday and fainted and would like a clinical person to call her

## 2022-07-05 NOTE — Telephone Encounter (Signed)
Called and left message for patient.

## 2022-07-06 ENCOUNTER — Telehealth: Payer: Self-pay | Admitting: Nurse Practitioner

## 2022-07-06 NOTE — Telephone Encounter (Signed)
Pt called back stating she received a VM from [place] and was calling Tonya back.

## 2022-07-06 NOTE — Telephone Encounter (Signed)
Spoke with patient. She had a syncopal episode this past Saturday. She states that her friends were with her. She lost conciseness for a few seconds and felt weak that afternoon and the next day. She states that she is feeling much better now and has returned to work. Offered her appointment for evaluation with labs. Patient declined today because she is feeling better. Will call back if anything changes. Advised to go straight to the ED if she has any more syncopal episodes.

## 2023-01-21 ENCOUNTER — Telehealth: Payer: Self-pay

## 2023-01-21 NOTE — Transitions of Care (Post Inpatient/ED Visit) (Cosign Needed)
   01/21/2023  Name: Kimorah Ridolfi MRN: [id number] DOB: 11/16/1960  Today's TOC FU Call Status: Today's TOC FU Call Status:: Unsuccessul Call (1st Attempt)  Attempted to reach the patient regarding the most recent Inpatient/ED visit.  Follow Up Plan: Additional outreach attempts will be made to reach the patient to complete the Transitions of Care (Post Inpatient/ED visit) call.   Signature Elyse Jarvis RMA'

## 2023-01-21 NOTE — Transitions of Care (Post Inpatient/ED Visit) (Signed)
   01/21/2023  Name: Sharon Christensen MRN: [id number] DOB: Aug 10, 1961  Today's TOC FU Call Status: Today's TOC FU Call Status:: Successful TOC FU Call Competed TOC FU Call Complete Date: 01/21/23  Transition Care Management Follow-up Telephone Call Date of Discharge: 01/19/23 Discharge Facility: Other ([facility]) ([facility]) Type of Discharge: Emergency Department Reason for ED Visit: Respiratory Respiratory Diagnosis:  (uri) How have you been since you were released from the hospital?: Same Any questions or concerns?: No  Items Reviewed: Did you receive and understand the discharge instructions provided?: Yes Medications obtained and verified?: Yes (Medications Reviewed) Any new allergies since your discharge?: No Dietary orders reviewed?: NA Do you have support at home?: Yes People in Home: friend(s)  Home Care and Equipment/Supplies: [place] Ordered?: No Any new equipment or medical supplies ordered?: No  Functional Questionnaire: Do you need assistance with bathing/showering or dressing?: No Do you need assistance with meal preparation?: No Do you need assistance with eating?: No Do you have difficulty maintaining continence: No Do you need assistance with getting out of bed/getting out of a chair/moving?: No Do you have difficulty managing or taking your medications?: No  Folllow up appointments reviewed: PCP Follow-up appointment confirmed?: Yes Date of PCP follow-up appointment?: 01/25/23 Follow-up Provider: [facility] Follow-up appointment confirmed?: No Reason Specialist Follow-Up Not Confirmed: Appointment Sceduled by [facility] Calling Clinician Do you need transportation to your follow-up appointment?: No Do you understand care options if your condition(s) worsen?: Yes-patient verbalized understanding    SIGNATURE.Elyse Jarvis RMA

## 2023-01-25 ENCOUNTER — Inpatient Hospital Stay: Payer: Self-pay | Admitting: Nurse Practitioner

## 2023-06-14 ENCOUNTER — Ambulatory Visit: Payer: Self-pay | Admitting: Nurse Practitioner

## 2023-06-21 ENCOUNTER — Other Ambulatory Visit: Payer: Self-pay | Admitting: Nurse Practitioner

## 2023-06-21 ENCOUNTER — Ambulatory Visit: Payer: Self-pay | Admitting: Nurse Practitioner

## 2023-06-21 DIAGNOSIS — I10 Essential (primary) hypertension: Secondary | ICD-10-CM

## 2023-06-21 DIAGNOSIS — E785 Hyperlipidemia, unspecified: Secondary | ICD-10-CM

## 2023-07-05 ENCOUNTER — Ambulatory Visit: Payer: Self-pay | Admitting: Nurse Practitioner

## 2023-07-19 ENCOUNTER — Ambulatory Visit: Payer: Self-pay | Admitting: Nurse Practitioner

## 2023-07-26 ENCOUNTER — Ambulatory Visit: Payer: Self-pay | Admitting: Nurse Practitioner

## 2023-08-02 ENCOUNTER — Encounter: Payer: Self-pay | Admitting: Nurse Practitioner

## 2023-08-02 ENCOUNTER — Ambulatory Visit (INDEPENDENT_AMBULATORY_CARE_PROVIDER_SITE_OTHER): Payer: Self-pay | Admitting: Nurse Practitioner

## 2023-08-02 VITALS — BP 131/75 | HR 79 | Temp 97.4°F | Resp 18 | Ht 62.0 in | Wt 146.8 lb

## 2023-08-02 DIAGNOSIS — I10 Essential (primary) hypertension: Secondary | ICD-10-CM | POA: Diagnosis not present

## 2023-08-02 DIAGNOSIS — E559 Vitamin D deficiency, unspecified: Secondary | ICD-10-CM | POA: Diagnosis not present

## 2023-08-02 DIAGNOSIS — R7303 Prediabetes: Secondary | ICD-10-CM

## 2023-08-02 DIAGNOSIS — E785 Hyperlipidemia, unspecified: Secondary | ICD-10-CM

## 2023-08-02 LAB — POCT GLYCOSYLATED HEMOGLOBIN (HGB A1C): Hemoglobin A1C: 5.8 % — AB (ref 4.0–5.6)

## 2023-08-02 MED ORDER — AMLODIPINE BESYLATE 10 MG PO TABS
10.0000 mg | ORAL_TABLET | Freq: Every day | ORAL | 2 refills | Status: DC
Start: 2023-08-02 — End: 2024-06-22

## 2023-08-02 MED ORDER — ATORVASTATIN CALCIUM 40 MG PO TABS
40.0000 mg | ORAL_TABLET | Freq: Every day | ORAL | 2 refills | Status: DC
Start: 2023-08-02 — End: 2024-06-22

## 2023-08-02 NOTE — Patient Instructions (Addendum)
1. Essential hypertension  - amLODipine (NORVASC) 10 MG tablet; Take 1 tablet (10 mg total) by mouth daily.  Dispense: 90 tablet; Refill: 2 - CBC - Comprehensive metabolic panel   2. Hyperlipidemia, unspecified hyperlipidemia type  - atorvastatin (LIPITOR) 40 MG tablet; Take 1 tablet (40 mg total) by mouth daily.  Dispense: 90 tablet; Refill: 2 - Lipid Panel   3. Prediabetes  - POCT glycosylated hemoglobin (Hb A1C)   4. Vitamin D deficiency  - Vitamin D, 25-hydroxy    Follow up:  Follow up in 12 months

## 2023-08-02 NOTE — Progress Notes (Signed)
Subjective   Patient ID: Sharon Christensen, female    DOB: 1961/04/06, 62 y.o.   MRN: [id number]  Chief Complaint  Patient presents with   Hypertension    Referring provider: Ivonne Andrew, NP  Sharon Christensen is a 62 y.o. female with Past Medical History: 11/2019: Bilateral lower extremity edema 10/2019: History of mycoplasma pneumonia No date: Hyperlipidemia No date: Hypertension 11/2019: Vitamin D deficiency 05/2020: Wheezes   Hypertension    Patient presents today for a follow-up on hypertension. Patient does need refills on medications.  A1C was 5.8. She does need blood work today.  Unfortunately patient just found out yesterday that she was laid off from her job.  We discussed that this is all new to her and a big life change.  We discussed that we do have a counselor here in office if she needs her. Denies f/c/s, n/v/d, hemoptysis, PND, leg swelling. Denies chest pain or edema.    No Known Allergies  Immunization History  Administered Date(s) Administered   PFIZER(Purple Top)SARS-COV-2 Vaccination 02/19/2020, 03/12/2020    Tobacco History: Social History   Tobacco Use  Smoking Status Every Day   Current packs/day: 0.50   Types: Cigarettes  Smokeless Tobacco Never   Ready to quit: Not Answered Counseling given: Not Answered   Outpatient Encounter Medications as of 08/02/2023  Medication Sig   albuterol (VENTOLIN HFA) 108 (90 Base) MCG/ACT inhaler Inhale 2 puffs into the lungs every 6 (six) hours as needed for wheezing or shortness of breath.   [DISCONTINUED] amLODipine (NORVASC) 10 MG tablet Take 1 tablet by mouth once daily   [DISCONTINUED] atorvastatin (LIPITOR) 40 MG tablet Take 1 tablet by mouth once daily   amLODipine (NORVASC) 10 MG tablet Take 1 tablet (10 mg total) by mouth daily.   atorvastatin (LIPITOR) 40 MG tablet Take 1 tablet (40 mg total) by mouth daily.   [DISCONTINUED] cetirizine (ZYRTEC) 10 MG tablet Take 1 tablet (10 mg total) by mouth  daily. (Patient not taking: Reported on 01/21/2023)   [DISCONTINUED] diphenhydrAMINE (BENADRYL ALLERGY) 25 mg capsule Take 1 capsule (25 mg total) by mouth every 6 (six) hours as needed. (Patient not taking: Reported on 06/08/2022)   [DISCONTINUED] Omega-3 Fatty Acids (FISH OIL) 1000 MG CAPS Take 3 capsules (3,000 mg total) by mouth daily. (Patient not taking: Reported on 06/08/2022)   No facility-administered encounter medications on file as of 08/02/2023.    Review of Systems  Review of Systems  Constitutional: Negative.   HENT: Negative.    Cardiovascular: Negative.   Gastrointestinal: Negative.   Allergic/Immunologic: Negative.   Neurological: Negative.   Psychiatric/Behavioral: Negative.       Objective:   BP 131/75   Pulse 79   Temp (!) 97.4 F (36.3 C)   Resp 18   Ht 5\' 2"  (1.575 m)   Wt 146 lb 12.8 oz (66.6 kg)   SpO2 98%   BMI 26.85 kg/m   Wt Readings from Last 5 Encounters:  08/02/23 146 lb 12.8 oz (66.6 kg)  06/08/22 165 lb 3.2 oz (74.9 kg)  09/18/21 163 lb (73.9 kg)  05/31/21 164 lb 0.6 oz (74.4 kg)  05/30/20 153 lb 9.6 oz (69.7 kg)     Physical Exam Vitals and nursing note reviewed.  Constitutional:      General: She is not in acute distress.    Appearance: She is well-developed.  Cardiovascular:     Rate and Rhythm: Normal rate and regular rhythm.  Pulmonary:  Effort: Pulmonary effort is normal.     Breath sounds: Normal breath sounds.  Neurological:     Mental Status: She is alert and oriented to person, place, and time.  Psychiatric:        Mood and Affect: Mood normal.        Behavior: Behavior normal.       Assessment & Plan:   Essential hypertension -     amLODIPine Besylate; Take 1 tablet (10 mg total) by mouth daily.  Dispense: 90 tablet; Refill: 2 -     CBC -     Comprehensive metabolic panel  Hyperlipidemia, unspecified hyperlipidemia type -     Atorvastatin Calcium; Take 1 tablet (40 mg total) by mouth daily.  Dispense: 90  tablet; Refill: 2 -     Lipid panel  Prediabetes -     POCT glycosylated hemoglobin (Hb A1C)  Vitamin D deficiency -     VITAMIN D 25 Hydroxy (Vit-D Deficiency, Fractures)     Return in about 1 year (around 08/01/2024).   Ivonne Andrew, NP 08/02/2023

## 2023-08-03 LAB — COMPREHENSIVE METABOLIC PANEL
ALT: 45 IU/L — ABNORMAL HIGH (ref 0–32)
AST: 37 IU/L (ref 0–40)
Albumin: 4.8 g/dL (ref 3.9–4.9)
Alkaline Phosphatase: 104 IU/L (ref 44–121)
BUN/Creatinine Ratio: 14 (ref 12–28)
BUN: 10 mg/dL (ref 8–27)
Bilirubin Total: 0.5 mg/dL (ref 0.0–1.2)
CO2: 22 mmol/L (ref 20–29)
Calcium: 9.6 mg/dL (ref 8.7–10.3)
Chloride: 105 mmol/L (ref 96–106)
Creatinine, Ser: 0.69 mg/dL (ref 0.57–1.00)
Globulin, Total: 2.3 g/dL (ref 1.5–4.5)
Glucose: 79 mg/dL (ref 70–99)
Potassium: 4.4 mmol/L (ref 3.5–5.2)
Sodium: 143 mmol/L (ref 134–144)
Total Protein: 7.1 g/dL (ref 6.0–8.5)
eGFR: 98 mL/min/{1.73_m2} (ref >59)

## 2023-08-03 LAB — CBC
Hematocrit: 42.3 % (ref 34.0–46.6)
Hemoglobin: 13.7 g/dL (ref 11.1–15.9)
MCH: 31.2 pg (ref 26.6–33.0)
MCHC: 32.4 g/dL (ref 31.5–35.7)
MCV: 96 fL (ref 79–97)
Platelets: 393 10*3/uL (ref 150–450)
RBC: 4.39 x10E6/uL (ref 3.77–5.28)
RDW: 12.2 % (ref 11.7–15.4)
WBC: 6.6 10*3/uL (ref 3.4–10.8)

## 2023-08-03 LAB — LIPID PANEL
Chol/HDL Ratio: 2.4 ratio (ref 0.0–4.4)
Cholesterol, Total: 156 mg/dL (ref 100–199)
HDL: 64 mg/dL (ref >39)
LDL Chol Calc (NIH): 72 mg/dL (ref 0–99)
Triglycerides: 116 mg/dL (ref 0–149)
VLDL Cholesterol Cal: 20 mg/dL (ref 5–40)

## 2023-08-03 LAB — VITAMIN D 25 HYDROXY (VIT D DEFICIENCY, FRACTURES): Vit D, 25-Hydroxy: 30.8 ng/mL (ref 30.0–100.0)

## 2023-10-02 ENCOUNTER — Encounter: Payer: Self-pay | Admitting: Nurse Practitioner

## 2023-10-02 ENCOUNTER — Ambulatory Visit (INDEPENDENT_AMBULATORY_CARE_PROVIDER_SITE_OTHER): Payer: BLUE CROSS/BLUE SHIELD | Admitting: Nurse Practitioner

## 2023-10-02 VITALS — BP 142/92 | HR 89 | Temp 98.5°F | Resp 14 | Ht 62.0 in | Wt 146.0 lb

## 2023-10-02 DIAGNOSIS — R42 Dizziness and giddiness: Secondary | ICD-10-CM

## 2023-10-02 MED ORDER — FLUTICASONE PROPIONATE 50 MCG/ACT NA SUSP
2.0000 | Freq: Every day | NASAL | 6 refills | Status: AC
Start: 2023-10-02 — End: ?

## 2023-10-02 MED ORDER — CETIRIZINE HCL 10 MG PO TABS: 10.0000 mg | ORAL_TABLET | Freq: Every day | ORAL | 11 refills | Status: DC

## 2023-10-02 MED ORDER — MECLIZINE HCL 12.5 MG PO TABS
12.5000 mg | ORAL_TABLET | Freq: Three times a day (TID) | ORAL | 0 refills | Status: AC | PRN
Start: 2023-10-02 — End: ?

## 2023-10-02 NOTE — Progress Notes (Signed)
Subjective   Patient ID: Sharon Christensen, female    DOB: [birth year], 62 y.o.   MRN: [id number]  Chief Complaint  Patient presents with   Dizziness    Referring provider: Ivonne Andrew, NP  Aylene Elsbury is a 62 y.o. female with Past Medical History: 11/2019: Bilateral lower extremity edema 10/2019: History of mycoplasma pneumonia No date: Hyperlipidemia No date: Hypertension 11/2019: Vitamin D deficiency 05/2020: Wheezes   Dizziness This is a new problem. The current episode started in the past 7 days. The problem occurs intermittently. The problem has been gradually improving. Associated symptoms include congestion and nausea. The symptoms are aggravated by bending. She has tried position changes and NSAIDs for the symptoms. The treatment provided mild relief.     No Known Allergies  Immunization History  Administered Date(s) Administered   PFIZER(Purple Top)SARS-COV-2 Vaccination 02/19/2020, 03/12/2020    Tobacco History: Social History   Tobacco Use  Smoking Status Every Day   Current packs/day: 0.50   Types: Cigarettes  Smokeless Tobacco Never   Ready to quit: Not Answered Counseling given: Not Answered   Outpatient Encounter Medications as of 10/02/2023  Medication Sig   albuterol (VENTOLIN HFA) 108 (90 Base) MCG/ACT inhaler Inhale 2 puffs into the lungs every 6 (six) hours as needed for wheezing or shortness of breath.   amLODipine (NORVASC) 10 MG tablet Take 1 tablet (10 mg total) by mouth daily.   atorvastatin (LIPITOR) 40 MG tablet Take 1 tablet (40 mg total) by mouth daily.   cetirizine (ZYRTEC) 10 MG tablet Take 1 tablet (10 mg total) by mouth daily.   fluticasone (FLONASE) 50 MCG/ACT nasal spray Place 2 sprays into both nostrils daily.   meclizine (ANTIVERT) 12.5 MG tablet Take 1 tablet (12.5 mg total) by mouth 3 (three) times daily as needed.   No facility-administered encounter medications on file as of 10/02/2023.    Review of  Systems  Review of Systems  Constitutional: Negative.   HENT:  Positive for congestion and ear pain.   Cardiovascular: Negative.   Gastrointestinal:  Positive for nausea.  Allergic/Immunologic: Negative.   Neurological:  Positive for dizziness.  Psychiatric/Behavioral: Negative.       Objective:   BP (!) 142/92 (BP Location: Left Arm, Patient Position: Sitting, Cuff Size: Normal)   Pulse 89   Temp 98.5 F (36.9 C)   Resp 14   Ht 5\' 2"  (1.575 m)   Wt 146 lb (66.2 kg)   SpO2 100%   BMI 26.70 kg/m   Wt Readings from Last 5 Encounters:  10/02/23 146 lb (66.2 kg)  08/02/23 146 lb 12.8 oz (66.6 kg)  06/08/22 165 lb 3.2 oz (74.9 kg)  09/18/21 163 lb (73.9 kg)  05/31/21 164 lb 0.6 oz (74.4 kg)     Physical Exam Vitals and nursing note reviewed.  Constitutional:      General: She is not in acute distress.    Appearance: She is well-developed.  Cardiovascular:     Rate and Rhythm: Normal rate and regular rhythm.  Pulmonary:     Effort: Pulmonary effort is normal.     Breath sounds: Normal breath sounds.  Neurological:     Mental Status: She is alert and oriented to person, place, and time.  Psychiatric:        Mood and Affect: Mood normal.        Behavior: Behavior normal.       Assessment & Plan:   Vertigo -     Fluticasone  Propionate; Place 2 sprays into both nostrils daily.  Dispense: 16 g; Refill: 6 -     Cetirizine HCl; Take 1 tablet (10 mg total) by mouth daily.  Dispense: 30 tablet; Refill: 11 -     Meclizine HCl; Take 1 tablet (12.5 mg total) by mouth 3 (three) times daily as needed.  Dispense: 30 tablet; Refill: 0 -     CBC -     Comprehensive metabolic panel     Return if symptoms worsen or fail to improve.     Ivonne Andrew, NP 10/02/2023

## 2023-10-02 NOTE — Patient Instructions (Addendum)
1. Vertigo  - fluticasone (FLONASE) 50 MCG/ACT nasal spray; Place 2 sprays into both nostrils daily.  Dispense: 16 g; Refill: 6 - cetirizine (ZYRTEC) 10 MG tablet; Take 1 tablet (10 mg total) by mouth daily.  Dispense: 30 tablet; Refill: 11 - meclizine (ANTIVERT) 12.5 MG tablet; Take 1 tablet (12.5 mg total) by mouth 3 (three) times daily as needed.  Dispense: 30 tablet; Refill: 0    Follow up:  Follow up in 3 months

## 2023-10-03 LAB — COMPREHENSIVE METABOLIC PANEL
ALT: 50 [IU]/L — ABNORMAL HIGH (ref 0–32)
AST: 34 [IU]/L (ref 0–40)
Albumin: 4.4 g/dL (ref 3.9–4.9)
Alkaline Phosphatase: 105 [IU]/L (ref 44–121)
BUN/Creatinine Ratio: 14 (ref 12–28)
BUN: 9 mg/dL (ref 8–27)
Bilirubin Total: 0.7 mg/dL (ref 0.0–1.2)
CO2: 23 mmol/L (ref 20–29)
Calcium: 9.5 mg/dL (ref 8.7–10.3)
Chloride: 105 mmol/L (ref 96–106)
Creatinine, Ser: 0.63 mg/dL (ref 0.57–1.00)
Globulin, Total: 2.4 g/dL (ref 1.5–4.5)
Glucose: 90 mg/dL (ref 70–99)
Potassium: 4.3 mmol/L (ref 3.5–5.2)
Sodium: 141 mmol/L (ref 134–144)
Total Protein: 6.8 g/dL (ref 6.0–8.5)
eGFR: 100 mL/min/{1.73_m2} (ref >59)

## 2023-10-03 LAB — CBC
Hematocrit: 43 % (ref 34.0–46.6)
Hemoglobin: 13.8 g/dL (ref 11.1–15.9)
MCH: 31.2 pg (ref 26.6–33.0)
MCHC: 32.1 g/dL (ref 31.5–35.7)
MCV: 97 fL (ref 79–97)
Platelets: 365 10*3/uL (ref 150–450)
RBC: 4.43 x10E6/uL (ref 3.77–5.28)
RDW: 12.5 % (ref 11.7–15.4)
WBC: 8.1 10*3/uL (ref 3.4–10.8)

## 2024-06-20 ENCOUNTER — Other Ambulatory Visit: Payer: Self-pay | Admitting: Nurse Practitioner

## 2024-06-20 DIAGNOSIS — E785 Hyperlipidemia, unspecified: Secondary | ICD-10-CM

## 2024-06-20 DIAGNOSIS — I10 Essential (primary) hypertension: Secondary | ICD-10-CM

## 2024-07-31 ENCOUNTER — Ambulatory Visit (INDEPENDENT_AMBULATORY_CARE_PROVIDER_SITE_OTHER): Payer: Self-pay | Admitting: Nurse Practitioner

## 2024-07-31 ENCOUNTER — Encounter: Payer: Self-pay | Admitting: Nurse Practitioner

## 2024-07-31 VITALS — BP 135/88 | HR 86 | Temp 97.9°F | Wt 142.4 lb

## 2024-07-31 DIAGNOSIS — M25512 Pain in left shoulder: Secondary | ICD-10-CM

## 2024-07-31 DIAGNOSIS — E785 Hyperlipidemia, unspecified: Secondary | ICD-10-CM

## 2024-07-31 DIAGNOSIS — I10 Essential (primary) hypertension: Secondary | ICD-10-CM

## 2024-07-31 MED ORDER — PREDNISONE 20 MG PO TABS: 20.0000 mg | ORAL_TABLET | Freq: Every day | ORAL | 0 refills | Status: AC

## 2024-07-31 MED ORDER — TIZANIDINE HCL 4 MG PO TABS: 4.0000 mg | ORAL_TABLET | Freq: Four times a day (QID) | ORAL | 0 refills | Status: AC | PRN

## 2024-07-31 MED ORDER — AMLODIPINE BESYLATE 10 MG PO TABS: 10.0000 mg | ORAL_TABLET | Freq: Every day | ORAL | 0 refills | Status: AC

## 2024-07-31 NOTE — Progress Notes (Signed)
 Subjective   Patient ID: Sharon Christensen, female    DOB: [birth year], 63 y.o.   MRN: [id number]  Chief Complaint  Patient presents with   Arm Pain    Left arm pain x1 month    Referring provider: Oley Bascom RAMAN, NP  Sharon Christensen is a 63 y.o. female with Past Medical History: 11/2019: Bilateral lower extremity edema 10/2019: History of mycoplasma pneumonia No date: Hyperlipidemia No date: Hypertension 11/2019: Vitamin D  deficiency 05/2020: Wheezes   HPI  Patient presents today for a follow-up visit.  Overall she has been doing well since her last visit here.  She has recently switched jobs and is having left shoulder pain and left elbow pain.  We will trial tizanidine  and prednisone .  She is due for labs. Denies f/c/s, n/v/d, hemoptysis, PND, leg swelling Denies chest pain or edema     No Known Allergies  Immunization History  Administered Date(s) Administered   PFIZER(Purple Top)SARS-COV-2 Vaccination 02/19/2020, 03/12/2020    Tobacco History: Social History   Tobacco Use  Smoking Status Every Day   Current packs/day: 0.50   Types: Cigarettes  Smokeless Tobacco Never   Ready to quit: Not Answered Counseling given: Yes   Outpatient Encounter Medications as of 07/31/2024  Medication Sig   albuterol  (VENTOLIN  HFA) 108 (90 Base) MCG/ACT inhaler Inhale 2 puffs into the lungs every 6 (six) hours as needed for wheezing or shortness of breath.   atorvastatin  (LIPITOR) 40 MG tablet Take 1 tablet by mouth once daily   fluticasone  (FLONASE ) 50 MCG/ACT nasal spray Place 2 sprays into both nostrils daily.   meclizine  (ANTIVERT ) 12.5 MG tablet Take 1 tablet (12.5 mg total) by mouth 3 (three) times daily as needed.   predniSONE  (DELTASONE ) 20 MG tablet Take 1 tablet (20 mg total) by mouth daily with breakfast.   tiZANidine  (ZANAFLEX ) 4 MG tablet Take 1 tablet (4 mg total) by mouth every 6 (six) hours as needed for muscle spasms.   [DISCONTINUED] amLODipine  (NORVASC ) 10 MG  tablet Take 1 tablet by mouth once daily   amLODipine  (NORVASC ) 10 MG tablet Take 1 tablet (10 mg total) by mouth daily.   cetirizine  (ZYRTEC ) 10 MG tablet Take 1 tablet (10 mg total) by mouth daily. (Patient not taking: Reported on 07/31/2024)   No facility-administered encounter medications on file as of 07/31/2024.    Review of Systems  Review of Systems  Constitutional: Negative.   HENT: Negative.    Cardiovascular: Negative.   Gastrointestinal: Negative.   Allergic/Immunologic: Negative.   Neurological: Negative.   Psychiatric/Behavioral: Negative.       Objective:   BP 135/88   Pulse 86   Temp 97.9 F (36.6 C) (Oral)   Wt 142 lb 6.4 oz (64.6 kg)   SpO2 100%   BMI 26.05 kg/m   Wt Readings from Last 5 Encounters:  07/31/24 142 lb 6.4 oz (64.6 kg)  10/02/23 146 lb (66.2 kg)  08/02/23 146 lb 12.8 oz (66.6 kg)  06/08/22 165 lb 3.2 oz (74.9 kg)  09/18/21 163 lb (73.9 kg)     Physical Exam Vitals and nursing note reviewed.  Constitutional:      General: She is not in acute distress.    Appearance: She is well-developed.  Cardiovascular:     Rate and Rhythm: Normal rate and regular rhythm.  Pulmonary:     Effort: Pulmonary effort is normal.     Breath sounds: Normal breath sounds.  Neurological:     Mental Status: She  is alert and oriented to person, place, and time.       Assessment & Plan:   Acute pain of left shoulder -     tiZANidine  HCl; Take 1 tablet (4 mg total) by mouth every 6 (six) hours as needed for muscle spasms.  Dispense: 30 tablet; Refill: 0 -     predniSONE ; Take 1 tablet (20 mg total) by mouth daily with breakfast.  Dispense: 5 tablet; Refill: 0  Essential hypertension -     amLODIPine  Besylate; Take 1 tablet (10 mg total) by mouth daily.  Dispense: 90 tablet; Refill: 0 -     CBC -     Comprehensive metabolic panel with GFR  Hyperlipidemia, unspecified hyperlipidemia type -     Lipid panel     Return in about 1 year (around  07/31/2025) for Physical.   Bascom GORMAN Borer, NP 07/31/2024

## 2024-08-03 ENCOUNTER — Other Ambulatory Visit: Payer: Self-pay | Admitting: Nurse Practitioner

## 2024-08-03 ENCOUNTER — Other Ambulatory Visit: Payer: Self-pay

## 2024-08-03 DIAGNOSIS — I10 Essential (primary) hypertension: Secondary | ICD-10-CM

## 2024-08-03 DIAGNOSIS — E785 Hyperlipidemia, unspecified: Secondary | ICD-10-CM

## 2024-08-14 ENCOUNTER — Other Ambulatory Visit: Payer: Self-pay

## 2024-09-15 ENCOUNTER — Other Ambulatory Visit: Payer: Self-pay | Admitting: Nurse Practitioner

## 2024-09-15 DIAGNOSIS — E785 Hyperlipidemia, unspecified: Secondary | ICD-10-CM

## 2024-09-23 ENCOUNTER — Other Ambulatory Visit: Payer: Self-pay | Admitting: Nurse Practitioner

## 2024-09-23 DIAGNOSIS — R42 Dizziness and giddiness: Secondary | ICD-10-CM

## 2024-10-20 ENCOUNTER — Other Ambulatory Visit: Payer: Self-pay | Admitting: Nurse Practitioner

## 2024-10-20 DIAGNOSIS — R42 Dizziness and giddiness: Secondary | ICD-10-CM

## 2024-11-09 ENCOUNTER — Ambulatory Visit: Payer: Self-pay

## 2024-11-09 NOTE — Telephone Encounter (Signed)
 FYI Only or Action Required?: Action required by provider: request for appointment.  Patient was last seen in primary care on 07/31/2024 by Oley Bascom RAMAN, NP.  Called Nurse Triage reporting vaginal symptoms.  Symptoms began several weeks ago.  Interventions attempted: Nothing.  Symptoms are: gradually worsening. Lump outer left side, painful. No fever, no drainage. Declines to be seen in another office. Asking to be worked in. Please advise pt.  Triage Disposition: See Physician Within 24 Hours  Patient/caregiver understands and will follow disposition?: Yes      Copied from CRM #[id number]. Topic: Appointments - Appointment Scheduling >> Nov 09, 2024 12:16 PM Emylou G wrote: Lump on her vagina that is sore -- would like to be seen tomorrow >> Nov 09, 2024  1:56 PM Suzen RAMAN wrote: Patient called back stating she was informed a nurse would be giving her a call back.SABRA assuming initial CRM should've been a Pink Word Reason for Disposition  [1] MILD to MODERATE vaginal pain AND [2] present > 24 hours  (Exception: Chronic pain.)  Answer Assessment - Initial Assessment Questions 1. SYMPTOM: What's the main symptom you're concerned about? (e.g., pain, itching, dryness)     lump 2. LOCATION: Where is the   located? (e.g., inside/outside, left/right)     Outside, left side 3. ONSET: When did the    start?     2 months, but worse 4. PAIN: Is there any pain? If Yes, ask: How bad is it? (Scale: 1-10; mild, moderate, severe)     5-6 5. ITCHING: Is there any itching? If Yes, ask: How bad is it? (Scale: 1-10; mild, moderate, severe)     no 6. CAUSE: What do you think is causing the discharge? Have you had the same problem before? What happened then?     unsure 7. OTHER SYMPTOMS: Do you have any other symptoms? (e.g., fever, itching, vaginal bleeding, pain with urination, injury to genital area, vaginal foreign body)     no 8. PREGNANCY: Is there any chance you are  pregnant? When was your last menstrual period?     no  Protocols used: Vaginal Symptoms-A-AH

## 2024-11-11 ENCOUNTER — Ambulatory Visit: Payer: Self-pay

## 2024-11-11 NOTE — Telephone Encounter (Signed)
 FYI Only or Action Required?: Action required by provider: request for appointment and advised UC in meantime.  Patient was last seen in primary care on 07/31/2024 by Oley Bascom RAMAN, NP.  Called Nurse Triage reporting Vaginal Pain and Mass.  Symptoms began several weeks ago.  Interventions attempted: Other: positioning.  Symptoms are: rapidly worsening.  Triage Disposition: See PCP When Office is Open (Within 3 Days)  Patient/caregiver understands and will follow disposition?: Yes    Copied from CRM #[id number]. Topic: Clinical - Red Word Triage >> Nov 11, 2024  8:29 AM Montie POUR wrote: Red Word that prompted transfer to Nurse Triage:  She has a hard bump that has come up at her left lip of her vagina. She is having pain of 4-5. Sitting hurts worse. This has been going for a couple of months (pea size) and now larger. Reason for Disposition  Tender lump (swelling or ball) at vaginal opening  Answer Assessment - Initial Assessment Questions This RN recommended that pt be examined in next couple days, no availability with PCP or other regional office, advised UC or OB/GYN in meantime.    Every once in while burning while pee Burning towards top of skin on inside where lump is When get up or try  Just feels raw or sore, constant 4-5/10 pain No fluid or redness Grew in the past 2 days, about the size of a quarter Very tender to touch Lump is hard No fever If up moving around a lot starts to itch Not shaved in a while On the lip itself not inside vagina No blisters, rash, or sores No established OB/GYN  Protocols used: Vulvar Symptoms-A-AH

## 2024-11-13 ENCOUNTER — Ambulatory Visit: Payer: Self-pay | Admitting: Nurse Practitioner

## 2025-08-04 ENCOUNTER — Encounter: Payer: Self-pay | Admitting: Nurse Practitioner
